# Patient Record
Sex: Male | Born: 1940
Health system: Southern US, Community
[De-identification: ages and names within clinical notes are randomized; demographics above are authoritative.]

## PROBLEM LIST (undated history)

## (undated) DIAGNOSIS — M71122 Other infective bursitis, left elbow: Secondary | ICD-10-CM

## (undated) HISTORY — PX: CATARACT EXTRACTION, BILATERAL: SHX1313

## (undated) HISTORY — PX: APPENDECTOMY: SHX54

---

## 1978-01-27 HISTORY — PX: ROTATOR CUFF REPAIR: SHX139

## 2004-11-19 ENCOUNTER — Ambulatory Visit (HOSPITAL_BASED_OUTPATIENT_CLINIC_OR_DEPARTMENT_OTHER): Admission: RE | Admit: 2004-11-19 | Discharge: 2004-11-19 | Payer: Self-pay | Admitting: Orthopedic Surgery

## 2013-06-24 ENCOUNTER — Ambulatory Visit (HOSPITAL_BASED_OUTPATIENT_CLINIC_OR_DEPARTMENT_OTHER): Payer: Medicare HMO | Admitting: Certified Registered"

## 2013-06-24 ENCOUNTER — Ambulatory Visit (HOSPITAL_BASED_OUTPATIENT_CLINIC_OR_DEPARTMENT_OTHER)
Admission: RE | Admit: 2013-06-24 | Discharge: 2013-06-24 | Disposition: A | Discharge status: Home / Self Care | Payer: Medicare HMO | Source: Ambulatory Visit | Attending: Orthopedic Surgery | Admitting: Orthopedic Surgery

## 2013-06-24 ENCOUNTER — Encounter (HOSPITAL_BASED_OUTPATIENT_CLINIC_OR_DEPARTMENT_OTHER): Payer: Self-pay

## 2013-06-24 ENCOUNTER — Encounter (HOSPITAL_BASED_OUTPATIENT_CLINIC_OR_DEPARTMENT_OTHER)
Admission: RE | Disposition: A | Discharge status: Home / Self Care | Payer: Self-pay | Source: Ambulatory Visit | Attending: Orthopedic Surgery

## 2013-06-24 ENCOUNTER — Encounter (HOSPITAL_BASED_OUTPATIENT_CLINIC_OR_DEPARTMENT_OTHER): Payer: Medicare HMO | Admitting: Certified Registered"

## 2013-06-24 DIAGNOSIS — M7020 Olecranon bursitis, unspecified elbow: Secondary | ICD-10-CM | POA: Insufficient documentation

## 2013-06-24 DIAGNOSIS — Z87891 Personal history of nicotine dependence: Secondary | ICD-10-CM | POA: Insufficient documentation

## 2013-06-24 DIAGNOSIS — M71122 Other infective bursitis, left elbow: Secondary | ICD-10-CM | POA: Diagnosis present

## 2013-06-24 DIAGNOSIS — A4902 Methicillin resistant Staphylococcus aureus infection, unspecified site: Secondary | ICD-10-CM | POA: Insufficient documentation

## 2013-06-24 HISTORY — DX: Other infective bursitis, left elbow: M71.122

## 2013-06-24 HISTORY — PX: I&D EXTREMITY: SHX5045

## 2013-06-24 LAB — POCT HEMOGLOBIN-HEMACUE: Hemoglobin: 15 g/dL (ref 13.0–17.0)

## 2013-06-24 SURGERY — IRRIGATION AND DEBRIDEMENT EXTREMITY
Anesthesia: General | Site: Elbow | Laterality: Left

## 2013-06-24 MED ORDER — CEFAZOLIN SODIUM-DEXTROSE 2-3 GM-% IV SOLR
INTRAVENOUS | Status: AC
  Filled 2013-06-24: qty 50

## 2013-06-24 MED ORDER — FENTANYL CITRATE 0.05 MG/ML IJ SOLN
INTRAMUSCULAR | Status: DC | PRN
  Administered 2013-06-24 (×2): 50 ug via INTRAVENOUS
  Administered 2013-06-24: 100 ug via INTRAVENOUS

## 2013-06-24 MED ORDER — SODIUM CHLORIDE 0.9 % IN NEBU
INHALATION_SOLUTION | RESPIRATORY_TRACT | Status: AC
  Filled 2013-06-24: qty 3

## 2013-06-24 MED ORDER — HYDROMORPHONE HCL PF 1 MG/ML IJ SOLN
INTRAMUSCULAR | Status: AC
  Filled 2013-06-24: qty 1

## 2013-06-24 MED ORDER — DEXAMETHASONE SODIUM PHOSPHATE 10 MG/ML IJ SOLN
INTRAMUSCULAR | Status: DC | PRN
  Administered 2013-06-24: 5 mg via INTRAVENOUS

## 2013-06-24 MED ORDER — MIDAZOLAM HCL 2 MG/2ML IJ SOLN
INTRAMUSCULAR | Status: AC
  Filled 2013-06-24: qty 2

## 2013-06-24 MED ORDER — CEPHALEXIN 500 MG PO CAPS: 500.0000 mg | ORAL_CAPSULE | Freq: Four times a day (QID) | ORAL | Status: DC

## 2013-06-24 MED ORDER — OXYCODONE HCL 5 MG/5ML PO SOLN: 5.0000 mg | Freq: Once | ORAL | Status: DC | PRN

## 2013-06-24 MED ORDER — PROPOFOL 10 MG/ML IV BOLUS
INTRAVENOUS | Status: DC | PRN
  Administered 2013-06-24: 150 mg via INTRAVENOUS

## 2013-06-24 MED ORDER — HYDROMORPHONE HCL PF 1 MG/ML IJ SOLN
0.2500 mg | INTRAMUSCULAR | Status: DC | PRN
  Administered 2013-06-24 (×2): 0.5 mg via INTRAVENOUS

## 2013-06-24 MED ORDER — FENTANYL CITRATE 0.05 MG/ML IJ SOLN
INTRAMUSCULAR | Status: AC
  Filled 2013-06-24: qty 4

## 2013-06-24 MED ORDER — MIDAZOLAM HCL 5 MG/5ML IJ SOLN
INTRAMUSCULAR | Status: DC | PRN
  Administered 2013-06-24: 2 mg via INTRAVENOUS

## 2013-06-24 MED ORDER — LIDOCAINE HCL (CARDIAC) 20 MG/ML IV SOLN
INTRAVENOUS | Status: DC | PRN
  Administered 2013-06-24: 40 mg via INTRAVENOUS

## 2013-06-24 MED ORDER — OXYCODONE HCL 5 MG PO TABS: 5.0000 mg | ORAL_TABLET | Freq: Once | ORAL | Status: DC | PRN

## 2013-06-24 MED ORDER — ONDANSETRON HCL 4 MG/2ML IJ SOLN
INTRAMUSCULAR | Status: DC | PRN
  Administered 2013-06-24: 4 mg via INTRAVENOUS

## 2013-06-24 MED ORDER — OXYCODONE-ACETAMINOPHEN 5-325 MG PO TABS: 1.0000 | ORAL_TABLET | Freq: Four times a day (QID) | ORAL | Status: DC | PRN

## 2013-06-24 MED ORDER — LACTATED RINGERS IV SOLN
INTRAVENOUS | Status: DC
  Administered 2013-06-24: 14:00:00 via INTRAVENOUS
  Administered 2013-06-24: 20 mL/h via INTRAVENOUS
  Administered 2013-06-24: 12:00:00 via INTRAVENOUS

## 2013-06-24 SURGICAL SUPPLY — 69 items
BANDAGE ELASTIC 4 VELCRO ST LF (GAUZE/BANDAGES/DRESSINGS) ×3 IMPLANT
BANDAGE ELASTIC 6 VELCRO ST LF (GAUZE/BANDAGES/DRESSINGS) IMPLANT
BANDAGE ESMARK 6X9 LF (GAUZE/BANDAGES/DRESSINGS) IMPLANT
BENZOIN TINCTURE PRP APPL 2/3 (GAUZE/BANDAGES/DRESSINGS) IMPLANT
BLADE 15 SAFETY STRL DISP (BLADE) ×3 IMPLANT
BNDG COHESIVE 4X5 TAN STRL (GAUZE/BANDAGES/DRESSINGS) ×3 IMPLANT
BNDG ESMARK 4X9 LF (GAUZE/BANDAGES/DRESSINGS) ×3 IMPLANT
BNDG ESMARK 6X9 LF (GAUZE/BANDAGES/DRESSINGS)
BNDG GAUZE ELAST 4 BULKY (GAUZE/BANDAGES/DRESSINGS) ×6 IMPLANT
BRUSH SCRUB EZ PLAIN DRY (MISCELLANEOUS) IMPLANT
CANISTER SUCT 1200ML W/VALVE (MISCELLANEOUS) ×3 IMPLANT
CLOSURE WOUND 1/2 X4 (GAUZE/BANDAGES/DRESSINGS)
COVER TABLE BACK 60X90 (DRAPES) ×3 IMPLANT
CUFF TOURNIQUET SINGLE 18IN (TOURNIQUET CUFF) ×3 IMPLANT
CUFF TOURNIQUET SINGLE 24IN (TOURNIQUET CUFF) IMPLANT
DECANTER SPIKE VIAL GLASS SM (MISCELLANEOUS) IMPLANT
DRAIN PENROSE 1/4X12 LTX STRL (WOUND CARE) ×3 IMPLANT
DRAPE EXTREMITY T 121X128X90 (DRAPE) ×3 IMPLANT
DRAPE INCISE IOBAN 66X45 STRL (DRAPES) IMPLANT
DRAPE OEC MINIVIEW 54X84 (DRAPES) IMPLANT
DRAPE U 20/CS (DRAPES) ×3 IMPLANT
DRAPE U-SHAPE 47X51 STRL (DRAPES) ×3 IMPLANT
DRSG PAD ABDOMINAL 8X10 ST (GAUZE/BANDAGES/DRESSINGS) IMPLANT
DURAPREP 26ML APPLICATOR (WOUND CARE) IMPLANT
ELECT REM PT RETURN 9FT ADLT (ELECTROSURGICAL) ×3
ELECTRODE REM PT RTRN 9FT ADLT (ELECTROSURGICAL) ×1 IMPLANT
GAUZE SPONGE 4X4 12PLY STRL (GAUZE/BANDAGES/DRESSINGS) ×3 IMPLANT
GAUZE XEROFORM 1X8 LF (GAUZE/BANDAGES/DRESSINGS) ×3 IMPLANT
GLOVE BIO SURGEON STRL SZ7 (GLOVE) ×3 IMPLANT
GLOVE BIO SURGEON STRL SZ8 (GLOVE) ×3 IMPLANT
GLOVE BIO SURGEON STRL SZ8.5 (GLOVE) ×3 IMPLANT
GLOVE BIOGEL PI IND STRL 8 (GLOVE) ×2 IMPLANT
GLOVE BIOGEL PI INDICATOR 8 (GLOVE) ×4
GLOVE EXAM NITRILE MD LF STRL (GLOVE) ×3 IMPLANT
GLOVE ORTHO TXT STRL SZ7.5 (GLOVE) ×3 IMPLANT
GOWN STRL REUS W/ TWL LRG LVL3 (GOWN DISPOSABLE) ×1 IMPLANT
GOWN STRL REUS W/ TWL XL LVL3 (GOWN DISPOSABLE) ×2 IMPLANT
GOWN STRL REUS W/TWL LRG LVL3 (GOWN DISPOSABLE) ×2
GOWN STRL REUS W/TWL XL LVL3 (GOWN DISPOSABLE) ×4
HANDPIECE INTERPULSE COAX TIP (DISPOSABLE)
NDL SAFETY ECLIPSE 18X1.5 (NEEDLE) ×1 IMPLANT
NEEDLE HYPO 18GX1.5 SHARP (NEEDLE) ×2
NEEDLE HYPO 25X1 1.5 SAFETY (NEEDLE) IMPLANT
NS IRRIG 1000ML POUR BTL (IV SOLUTION) ×3 IMPLANT
PACK BASIN DAY SURGERY FS (CUSTOM PROCEDURE TRAY) ×3 IMPLANT
PAD CAST 4YDX4 CTTN HI CHSV (CAST SUPPLIES) ×1 IMPLANT
PADDING CAST COTTON 4X4 STRL (CAST SUPPLIES) ×2
PENCIL BUTTON HOLSTER BLD 10FT (ELECTRODE) ×3 IMPLANT
SET HNDPC FAN SPRY TIP SCT (DISPOSABLE) IMPLANT
SLEEVE SCD COMPRESS KNEE MED (MISCELLANEOUS) ×3 IMPLANT
SPLINT FAST PLASTER 5X30 (CAST SUPPLIES) ×20
SPLINT PLASTER CAST FAST 5X30 (CAST SUPPLIES) ×10 IMPLANT
SPONGE LAP 18X18 X RAY DECT (DISPOSABLE) IMPLANT
SPONGE LAP 4X18 X RAY DECT (DISPOSABLE) ×3 IMPLANT
STAPLER VISISTAT 35W (STAPLE) IMPLANT
STRIP CLOSURE SKIN 1/2X4 (GAUZE/BANDAGES/DRESSINGS) IMPLANT
SUT ETHILON 3 0 PS 1 (SUTURE) ×3 IMPLANT
SUT ETHILON 4 0 PS 2 18 (SUTURE) IMPLANT
SUT MNCRL AB 4-0 PS2 18 (SUTURE) IMPLANT
SUT VIC AB 0 CT1 27 (SUTURE)
SUT VIC AB 0 CT1 27XBRD ANBCTR (SUTURE) IMPLANT
SUT VIC AB 2-0 SH 18 (SUTURE) IMPLANT
SUT VIC AB 3-0 SH 27 (SUTURE)
SUT VIC AB 3-0 SH 27X BRD (SUTURE) IMPLANT
SUT VICRYL 3-0 CR8 SH (SUTURE) IMPLANT
SUT VICRYL 4-0 PS2 18IN ABS (SUTURE) IMPLANT
SWAB COLLECTION DEVICE MRSA (MISCELLANEOUS) ×3 IMPLANT
SYR BULB 3OZ (MISCELLANEOUS) ×3 IMPLANT
TUBE ANAEROBIC SPECIMEN COL (MISCELLANEOUS) ×3 IMPLANT

## 2013-06-24 NOTE — Discharge Instructions (Signed)
Diet: As you were doing prior to hospitalization   Shower:  May shower but keep the wounds dry, use an occlusive plastic wrap, NO SOAKING IN TUB.  If the bandage gets wet, change with a clean dry gauze.  Dressing:  Change the dressing daily beginning Saturday, May 30, okay to remove the posterior splint to change the dressing, but reapply the splint after each dressing change to protect the elbow.     Activity:  Increase activity slowly as tolerated, but follow the weight bearing instructions below.  No lifting or driving for 6 weeks.   To prevent constipation: you may use a stool softener such as -  Colace (over the counter) 100 mg by mouth twice a day  Drink plenty of fluids (prune juice may be helpful) and high fiber foods Miralax (over the counter) for constipation as needed.    Itching:  If you experience itching with your medications, try taking only a single pain pill, or even half a pain pill at a time.  You may take up to 10 pain pills per day, and you can also use benadryl over the counter for itching or also to help with sleep.   Precautions:  If you experience chest pain or shortness of breath - call 911 immediately for transfer to the hospital emergency department!!  If you develop a fever greater that 101 F, purulent drainage from wound, increased redness or drainage from wound, or calf pain -- Call the office at [phone]                                                Follow- Up Appointment:  Please call for an appointment to be seen on the early part of next week, Monday, Tuesday, or Wednesday. - [phone]      Post Anesthesia Home Care Instructions  Activity: Get plenty of rest for the remainder of the day. A responsible adult should stay with you for 24 hours following the procedure.  For the next 24 hours, DO NOT: -Drive a car -Paediatric nurse -Drink alcoholic beverages -Take any medication unless instructed by your physician -Make any legal decisions  or sign important papers.  Meals: Start with liquid foods such as gelatin or soup. Progress to regular foods as tolerated. Avoid greasy, spicy, heavy foods. If nausea and/or vomiting occur, drink only clear liquids until the nausea and/or vomiting subsides. Call your physician if vomiting continues.  Special Instructions/Symptoms: Your throat may feel dry or sore from the anesthesia or the breathing tube placed in your throat during surgery. If this causes discomfort, gargle with warm salt water. The discomfort should disappear within 24 hours.

## 2013-06-24 NOTE — Op Note (Signed)
06/24/2013  2:49 PM  PATIENT:  Christian Perkins    PRE-OPERATIVE DIAGNOSIS:  left elbow septic olecranon  POST-OPERATIVE DIAGNOSIS:  Same  PROCEDURE:  IRRIGATION AND DEBRIDEMENT LEFT ELBOW with excision of septic olecranon bursa  SURGEON:  Johnny Bridge, MD  PHYSICIAN ASSISTANT: Joya Gaskins, OPA-C, present and scrubbed throughout the case, critical for completion in a timely fashion, and for retraction, instrumentation, and closure.  ANESTHESIA:   General  PREOPERATIVE INDICATIONS:  Christian Perkins is a  73 y.o. male with a diagnosis of septic left elbow bursitis who failed conservative measures and elected for surgical management.    The risks benefits and alternatives were discussed with the patient preoperatively including but not limited to the risks of infection, bleeding, nerve injury, cardiopulmonary complications, the need for revision surgery, among others, and the patient was willing to proceed.  OPERATIVE IMPLANTS: Penrose drain  OPERATIVE FINDINGS: Septic olecranon bursa did not appear to track into the joint or into the olecranon itself.  OPERATIVE PROCEDURE: The patient is brought to the operating room and placed in the supine position. Antibiotics were held until after operative cultures were given, and then 2 g of intravenous Ancef were given. The left upper extremity was prepped and draped in usual sterile fashion. Time out was performed. The arm was elevated, and the tourniquet was inflated.  An 18-gauge needle was used to aspirate about 3 cc of purulent fluid from the olecranon bursa.  I then made an incision directly over the olecranon bursa, over the sinus tract that was draining pus, and excised a sinus tract, and found a layer between the skin and the bursa itself. The necrotic bursal tissue was excised sharply, and then I used a rongeur, as well as a curette, to debride the deep layer. This was effectively overlying the periosteum and muscle fascia. The  incision I made was approximately 4 cm long.  After complete excision was performed, I irrigated the wounds copiously, and placed 2 sutures, leaving a fair amount of the incision wide open, and also placing a Penrose drain, and then applied sterile gauze, followed by a removable posterior splint.  The patient was awakened, the tourniquet was released, and he returned to the PACU in stable and satisfactory condition.  He tolerated the procedure well, and there were no complications. We will have him continue with his doxycycline, and plan for a wound check early next week.

## 2013-06-24 NOTE — Anesthesia Postprocedure Evaluation (Addendum)
  Anesthesia Post-op Note  Patient: Christian Perkins  Procedure(s) Performed: Procedure(s): IRRIGATION AND DEBRIDEMENT LEFT ELBOW (Left)  Patient Location: PACU  Anesthesia Type:General   Level of Consciousness: awake and alert   Airway and Oxygen Therapy: Patient Spontanous Breathing  Post-op Pain: mild  Post-op Assessment: Post-op Vital signs reviewed, Patient's Cardiovascular Status Stable and Respiratory Function Stable  Post-op Vital Signs: Reviewed  Filed Vitals:   06/24/13 1545  BP: 126/64  Pulse: 62  Temp:   Resp: 14    Complications: No apparent anesthesia complications

## 2013-06-24 NOTE — H&P (Signed)
PREOPERATIVE H&P  Chief Complaint: left elbow bursitis  HPI: Christian Perkins is a 73 y.o. male who presents for preoperative history and physical with a diagnosis of left elbow bursitis. Symptoms are rated as moderate to severe, and have been worsening.  This is significantly impairing activities of daily living.  He has elected for surgical management. He had a scrape that was about a month ago over the olecranon, and then had persistent swelling, ultimately it broke open and had drainage, did have an aspiration done by an outside physician, and has had purulent drainage coming from his olecranon bursa upper since. He has also had redness and erythema diffusely around the arm and forearm.  History reviewed. No pertinent past medical history., he does report hyperglycemia, but says that his blood sugars are reasonably well controlled and he is not truly diabetic. Past Surgical History  Procedure Laterality Date  . Rotator cuff repair Left 1980  . Appendectomy      age 8   History   Social History  . Marital Status: Married    Spouse Name: N/A    Number of Children: N/A  . Years of Education: N/A   Social History Main Topics  . Smoking status: Former Smoker -- 0.50 packs/day for 20 years    Types: Cigarettes    Quit date: 05/11/2013  . Smokeless tobacco: None  . Alcohol Use: Yes  . Drug Use: No  . Sexual Activity: None   Other Topics Concern  . None   Social History Narrative  . None   History reviewed. No pertinent family history. Not on File Prior to Admission medications   Not on File     Positive ROS: All other systems have been reviewed and were otherwise negative with the exception of those mentioned in the HPI and as above.  Physical Exam: General: Alert, no acute distress Cardiovascular: No pedal edema Respiratory: No cyanosis, no use of accessory musculature GI: No organomegaly, abdomen is soft and non-tender Skin: He has a pinhole opening over the  olecranon bursa with draining purulence Neurologic: Sensation intact distally Psychiatric: Patient is competent for consent with normal mood and affect Lymphatic: No axillary or cervical lymphadenopathy  MUSCULOSKELETAL: Left elbow has a boggy purulent draining olecranon bursa, with erythema diffusely around the forearm, but he still has essentially full range of motion of the elbow.  Assessment: left elbow septic olecranon bursitis with coexisting hyperglycemia, probable mild diabetes.  Plan: Plan for Procedure(s): IRRIGATION AND DEBRIDEMENT LEFT SEPTIC OLECRANON BURSITIS, ELBOW  The risks benefits and alternatives were discussed with the patient including but not limited to the risks of nonoperative treatment, versus surgical intervention including infection, bleeding, nerve injury,  blood clots, cardiopulmonary complications, morbidity, mortality, among others, and they were willing to proceed. We've also discussed the risk for the development of osteomyelitis, recurrent infection, the potential need for IV antibiotics, the potential for further surgical intervention, among others.  Johnny Bridge, MD Cell [phone]   06/24/2013 2:15 PM

## 2013-06-24 NOTE — Anesthesia Procedure Notes (Signed)
Procedure Name: LMA Insertion Date/Time: 06/24/2013 2:21 PM Performed by: Maryella Shivers Pre-anesthesia Checklist: Patient identified, Emergency Drugs available, Suction available and Patient being monitored Patient Re-evaluated:Patient Re-evaluated prior to inductionOxygen Delivery Method: Circle System Utilized Preoxygenation: Pre-oxygenation with 100% oxygen Intubation Type: IV induction Ventilation: Mask ventilation without difficulty LMA: LMA inserted LMA Size: 4.0 Number of attempts: 1 Airway Equipment and Method: bite block Placement Confirmation: positive ETCO2 Tube secured with: Tape Dental Injury: Teeth and Oropharynx as per pre-operative assessment

## 2013-06-24 NOTE — Anesthesia Preprocedure Evaluation (Signed)
Anesthesia Evaluation  Patient identified by MRN, date of birth, ID band Patient awake    Reviewed: Allergy & Precautions, H&P , NPO status , Patient's Chart, lab work & pertinent test results  Airway Mallampati: II  TM Distance: >3 FB Neck ROM: Full    Dental no notable dental hx. (+) Teeth Intact, Dental Advisory Given   Pulmonary neg pulmonary ROS,  breath sounds clear to auscultation  Pulmonary exam normal       Cardiovascular negative cardio ROS  Rhythm:Regular Rate:Normal     Neuro/Psych negative neurological ROS  negative psych ROS   GI/Hepatic negative GI ROS, Neg liver ROS,   Endo/Other  negative endocrine ROS  Renal/GU negative Renal ROS  negative genitourinary   Musculoskeletal   Abdominal   Peds  Hematology negative hematology ROS (+)   Anesthesia Other Findings   Reproductive/Obstetrics negative OB ROS                            Anesthesia Physical Anesthesia Plan  ASA: I  Anesthesia Plan: General   Post-op Pain Management:    Induction: Intravenous  Airway Management Planned: LMA  Additional Equipment:   Intra-op Plan:   Post-operative Plan: Extubation in OR  Informed Consent: I have reviewed the patients History and Physical, chart, labs and discussed the procedure including the risks, benefits and alternatives for the proposed anesthesia with the patient or authorized representative who has indicated his/her understanding and acceptance.   Dental advisory given  Plan Discussed with: CRNA  Anesthesia Plan Comments:         Anesthesia Quick Evaluation  

## 2013-06-24 NOTE — Transfer of Care (Signed)
Immediate Anesthesia Transfer of Care Note  Patient: Christian Perkins  Procedure(s) Performed: Procedure(s): IRRIGATION AND DEBRIDEMENT LEFT ELBOW (Left)  Patient Location: PACU  Anesthesia Type:General  Level of Consciousness: sedated  Airway & Oxygen Therapy: Patient Spontanous Breathing and Patient connected to face mask oxygen  Post-op Assessment: Report given to PACU RN and Post -op Vital signs reviewed and stable  Post vital signs: Reviewed and stable  Complications: No apparent anesthesia complications

## 2013-06-27 ENCOUNTER — Encounter (HOSPITAL_BASED_OUTPATIENT_CLINIC_OR_DEPARTMENT_OTHER): Payer: Self-pay | Admitting: Orthopedic Surgery

## 2013-06-27 ENCOUNTER — Other Ambulatory Visit: Payer: Self-pay | Admitting: Orthopedic Surgery

## 2013-06-27 ENCOUNTER — Ambulatory Visit (HOSPITAL_COMMUNITY)
Admission: RE | Admit: 2013-06-27 | Discharge: 2013-06-27 | Disposition: A | Discharge status: Home / Self Care | Payer: Medicare HMO | Source: Ambulatory Visit | Attending: Orthopedic Surgery | Admitting: Orthopedic Surgery

## 2013-06-27 DIAGNOSIS — M71122 Other infective bursitis, left elbow: Secondary | ICD-10-CM

## 2013-06-27 DIAGNOSIS — M009 Pyogenic arthritis, unspecified: Secondary | ICD-10-CM | POA: Insufficient documentation

## 2013-06-27 LAB — BODY FLUID CULTURE

## 2013-06-27 NOTE — Procedures (Signed)
R arm PowerPICC placed under US and fluoroscopy No ptx on spot chest radiograph. No complication No blood loss. See complete dictation in [facility] PACS.  

## 2013-06-28 ENCOUNTER — Ambulatory Visit (INDEPENDENT_AMBULATORY_CARE_PROVIDER_SITE_OTHER): Payer: Medicare HMO | Admitting: Internal Medicine

## 2013-06-28 ENCOUNTER — Encounter: Payer: Self-pay | Admitting: Internal Medicine

## 2013-06-28 VITALS — Temp 98.2°F | Ht 69.0 in | Wt 158.8 lb

## 2013-06-28 DIAGNOSIS — A499 Bacterial infection, unspecified: Secondary | ICD-10-CM

## 2013-06-28 DIAGNOSIS — E785 Hyperlipidemia, unspecified: Secondary | ICD-10-CM

## 2013-06-28 DIAGNOSIS — B9689 Other specified bacterial agents as the cause of diseases classified elsewhere: Secondary | ICD-10-CM

## 2013-06-28 DIAGNOSIS — M71122 Other infective bursitis, left elbow: Secondary | ICD-10-CM

## 2013-06-28 DIAGNOSIS — M7020 Olecranon bursitis, unspecified elbow: Secondary | ICD-10-CM

## 2013-06-28 MED ORDER — SODIUM CHLORIDE 0.9 % IV SOLN: 750.0000 mg | Freq: Two times a day (BID) | INTRAVENOUS | Status: DC

## 2013-06-28 NOTE — Progress Notes (Signed)
Patient ID: Christian Perkins, male   DOB: 03/07/1940, 73 y.o.   MRN: [id number]         [facility] for Infectious Disease  Reason for Consult: Left arm MRSA olecranon bursitis Referring Physician: Dr. Marchia Bond  Patient Active Problem List   Diagnosis Date Noted  . Dyslipidemia 06/28/2013  . Septic olecranon bursitis of left elbow 06/24/2013    Patient's Medications  New Prescriptions   VANCOMYCIN 750 MG IN SODIUM CHLORIDE 0.9 % 150 ML    Inject 750 mg into the vein every 12 (twelve) hours.  Previous Medications   CALCIUM CARBONATE 1250 MG CAPSULE    Take 1,250 mg by mouth daily.   OXYCODONE-ACETAMINOPHEN (ROXICET) 5-325 MG PER TABLET    Take 1-2 tablets by mouth every 6 (six) hours as needed for severe pain.   SIMVASTATIN (ZOCOR) 20 MG TABLET    Take 20 mg by mouth daily.  Modified Medications   No medications on file  Discontinued Medications   CEPHALEXIN (KEFLEX) 500 MG CAPSULE    Take 1 capsule (500 mg total) by mouth 4 (four) times daily.   DOXYCYCLINE (VIBRAMYCIN) 100 MG CAPSULE    Take 100 mg by mouth 2 (two) times daily.    Recommendations: 1. Check basic metabolic panel, sedimentation rate and C-reactive protein 2. Start IV vancomycin 3. Discontinue oral doxycycline 4. Followup in 10 days   Assessment: He has MRSA olecranon bursitis. I would suggest IV vancomycin as initial therapy, especially since the operative note indicates that the infection was very close to bone. He is in agreement with that plan.   HPI: Christian Perkins is a 73 y.o. male who recalls having a scrape or bump on his left elbow about 2-1/2 months ago. He states that he is bothered by very thin, sensitive skin and small bumps or scrapes off and heals slowly. About 6 weeks ago he noticed sudden swelling over the tip of his elbow associated with a small amount of spontaneous drainage of Hilton fluid. He was seen in the office of his primary care physician, Dr. Trilby Drummer, on May 19. The  area was aspirated and I am told that grew MRSA. The area of swelling to diminish significantly after aspiration but recurred around May 25 and was associated with spreading redness and swelling of his entire arm. He was referred to Dr. Mardelle Matte and started on oral doxycycline on May 26. He was admitted to the hospital and underwent incision and debridement with bursectomy on May 29. Operative cultures have grown MRSA again. Dr. Luanna Cole operative note indicated that the infection went down right over the periosteum. He is feeling better. Yesterday he had a PICC placed and is here today to consider IV antibiotic therapy.  Review of Systems: Constitutional: negative for chills, fevers and sweats Eyes: negative Ears, nose, mouth, throat, and face: negative Respiratory: negative Cardiovascular: negative Gastrointestinal: negative Genitourinary:negative    Past Medical History  Diagnosis Date  . Septic olecranon bursitis of left elbow 06/24/2013    History  Substance Use Topics  . Smoking status: Former Smoker -- 0.50 packs/day for 20 years    Types: Cigarettes    Quit date: 05/11/2013  . Smokeless tobacco: Not on file  . Alcohol Use: Yes    No family history on file. Not on File  OBJECTIVE: Temperature 98.2 F (36.8 C), temperature source Oral, height 5\' 9"  (1.753 m), weight 158 lb 12 oz (72.009 kg). General: He is in good spirits and in  no distress Skin: He has thin sun damaged skin on his arms and hands with scattered ecchymoses Lungs: Clear Cor: Regular S1 and S2 with no murmurs Joints and extremities: He has some mild nonpitting edema on the dorsum of his left hand and wrist. He indicates he has diminished swelling and redness over his forearm and upper arm. He has an incision over the tip of the left elbow with scant bloody drainage.  Microbiology: Recent Results (from the past 240 hour(s))  BODY FLUID CULTURE     Status: None   Collection Time    06/24/13  2:40 PM       Result Value Ref Range Status   Specimen Description SYNOVIAL FLUID   Final   Special Requests     Final   Value: PATIENT ON FOLLOWING DOXYCYCLINE LEFT ELBOW SENT SWAB   Gram Stain     Final   Value: MODERATE WBC PRESENT, PREDOMINANTLY PMN     NO ORGANISMS SEEN     Performed at [facility]   Culture     Final   Value: RARE METHICILLIN RESISTANT STAPHYLOCOCCUS AUREUS     Note: RIFAMPIN AND GENTAMICIN SHOULD NOT BE USED AS SINGLE DRUGS FOR TREATMENT OF STAPH INFECTIONS. This organism DOES NOT demonstrate inducible Clindamycin resistance in vitro.     Note: CRITICAL RESULT CALLED TO, READ BACK BY AND VERIFIED WITH: MAURICE JONES 06/26/13 2 1:26PM BY RUSCOE A.     Performed at [facility]   Report Status 06/27/2013 FINAL   Final   Organism ID, Bacteria METHICILLIN RESISTANT STAPHYLOCOCCUS AUREUS   Final  ANAEROBIC CULTURE     Status: None   Collection Time    06/24/13  2:40 PM      Result Value Ref Range Status   Specimen Description SYNOVIAL FLUID   Final   Special Requests     Final   Value: PATIENT ON FOLLOWING DOXYCYCLINE LEFT ELBOW SENT ON SWAB   Gram Stain     Final   Value: MODERATE WBC PRESENT, PREDOMINANTLY PMN     NO ORGANISMS SEEN     Performed at [facility]   Culture     Final   Value: NO ANAEROBES ISOLATED; CULTURE IN PROGRESS FOR 5 DAYS     Performed at [facility]   Report Status PENDING   Incomplete  TISSUE CULTURE     Status: None   Collection Time    06/24/13  2:47 PM      Result Value Ref Range Status   Specimen Description TISSUE   Final   Special Requests PATIENT ON FOLLOWING DOXYCYCLINE LEFT ELBOW BURSA   Final   Gram Stain     Final   Value: RARE WBC PRESENT,BOTH PMN AND MONONUCLEAR     NO ORGANISMS SEEN     Performed at [facility]     Final   Value: Culture reincubated for better growth     Performed at [facility]   Report Status PENDING   Incomplete  ANAEROBIC CULTURE     Status: None    Collection Time    06/24/13  2:47 PM      Result Value Ref Range Status   Specimen Description TISSUE   Final   Special Requests PATIENT ON FOLLOWING DOXYCYCLINE LEFT ELBOW BURSA   Final   Gram Stain     Final   Value: NO WBC SEEN     NO ORGANISMS SEEN  Performed at [facility]     Final   Value: NO ANAEROBES ISOLATED; CULTURE IN PROGRESS FOR 5 DAYS     Performed at [facility]   Report Status PENDING   Incomplete    Michel Bickers, MD [facility] for [facility] [phone] pager   [phone] cell 06/28/2013, 4:21 PM

## 2013-06-29 LAB — SEDIMENTATION RATE: Sed Rate: 18 mm/hr — ABNORMAL HIGH (ref 0–16)

## 2013-06-29 LAB — ANAEROBIC CULTURE: GRAM STAIN: NONE SEEN

## 2013-06-29 LAB — BASIC METABOLIC PANEL
BUN: 16 mg/dL (ref 6–23)
CO2: 24 meq/L (ref 19–32)
CREATININE: 0.79 mg/dL (ref 0.50–1.35)
Calcium: 9.6 mg/dL (ref 8.4–10.5)
Chloride: 101 mEq/L (ref 96–112)
Glucose, Bld: 104 mg/dL — ABNORMAL HIGH (ref 70–99)
Potassium: 4 mEq/L (ref 3.5–5.3)
Sodium: 138 mEq/L (ref 135–145)

## 2013-06-29 LAB — C-REACTIVE PROTEIN: CRP: 1.3 mg/dL — ABNORMAL HIGH (ref <0.60)

## 2013-06-30 LAB — TISSUE CULTURE

## 2013-07-10 ENCOUNTER — Telehealth: Payer: Self-pay | Admitting: Infectious Diseases

## 2013-07-10 NOTE — Telephone Encounter (Signed)
73 yo M with recent aspirate (06-24-13) for MRSA bursitis. He has been on vancomycin. This AM, he has developed a pruritic rash.He has also noted increased swelling at his surgical site. No drainage.  I advised him: - Take benadryl as needed  - Stop vanco - Call ID clinic in AM for new anbx - If worsening swelling at surgical site or new drainage, call his surgeon and go to ED.

## 2013-07-11 NOTE — Telephone Encounter (Signed)
Manuela Schwartz at [facility] called in reference to this patient and what to do with his Vanc as the dose was held pending a call today. Advised her not sure but will ask his doctor and give her a call back. Reminded her the patient has an appt tomorrow, 07/12/13, with Dr Megan Salon.

## 2013-07-12 ENCOUNTER — Ambulatory Visit (INDEPENDENT_AMBULATORY_CARE_PROVIDER_SITE_OTHER): Payer: Medicare HMO | Admitting: Internal Medicine

## 2013-07-12 ENCOUNTER — Encounter: Payer: Self-pay | Admitting: Internal Medicine

## 2013-07-12 VITALS — BP 146/67 | HR 70 | Temp 98.4°F | Wt 161.2 lb

## 2013-07-12 DIAGNOSIS — B9689 Other specified bacterial agents as the cause of diseases classified elsewhere: Secondary | ICD-10-CM

## 2013-07-12 DIAGNOSIS — A499 Bacterial infection, unspecified: Secondary | ICD-10-CM

## 2013-07-12 DIAGNOSIS — M7020 Olecranon bursitis, unspecified elbow: Secondary | ICD-10-CM

## 2013-07-12 DIAGNOSIS — M71122 Other infective bursitis, left elbow: Secondary | ICD-10-CM

## 2013-07-12 MED ORDER — DOXYCYCLINE HYCLATE 100 MG PO TABS: 100.0000 mg | ORAL_TABLET | Freq: Two times a day (BID) | ORAL | Status: DC

## 2013-07-12 NOTE — Progress Notes (Signed)
Patient ID: Christian Perkins, male   DOB: December 19, 1940, 73 y.o.   MRN: [id number]         [facility] for Infectious Disease  Patient Active Problem List   Diagnosis Date Noted  . Dyslipidemia 06/28/2013  . Septic olecranon bursitis of left elbow 06/24/2013    Patient's Medications  New Prescriptions   DOXYCYCLINE (VIBRA-TABS) 100 MG TABLET    Take 1 tablet (100 mg total) by mouth 2 (two) times daily.  Previous Medications   CALCIUM CARBONATE 1250 MG CAPSULE    Take 1,250 mg by mouth daily.   SIMVASTATIN (ZOCOR) 20 MG TABLET    Take 20 mg by mouth daily.  Modified Medications   No medications on file  Discontinued Medications   OXYCODONE-ACETAMINOPHEN (ROXICET) 5-325 MG PER TABLET    Take 1-2 tablets by mouth every 6 (six) hours as needed for severe pain.    Subjective: Christian Perkins is in for his followup appointment. He has now completed 18 days of total antibiotic therapy including about 11 days of IV vancomycin for his MRSA left olecranon bursitis. 4 days ago he developed a diffuse, pruritic red rash. He spoke with my partner, Dr. Johnnye Sima, who stopped the vancomycin. His rash has now resolved.  His left elbow is feeling much better. He is not needing any pain medication. He has not had any drainage from his incision. The sutures have been removed.  Review of Systems: Pertinent items are noted in HPI.  Past Medical History  Diagnosis Date  . Septic olecranon bursitis of left elbow 06/24/2013    History  Substance Use Topics  . Smoking status: Former Smoker -- 0.50 packs/day for 20 years    Types: Cigarettes    Quit date: 05/11/2013  . Smokeless tobacco: Not on file  . Alcohol Use: Yes    No family history on file.  Allergies  Allergen Reactions  . 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 His rash has resolved. He has scattered ecchymoses on both arms Left elbow: He has 3 Steri-Strips over his incision which is healing nicely. There is no drainage, erythema, swelling or tenderness with palpation    Assessment: His MRSA infection that has responded well to incision and drainage, olecranon bursectomy and antibiotic therapy. He appears to have developed an allergic reaction to vancomycin which is now resolved off of vancomycin. I gave him 2 options, the first being continued observation off antibiotics and the second being restarting oral doxycycline. He prefers the latter.  Plan: 1. Repeat doxycycline 100 mg twice daily 2. Followup in 2 weeks 3. Have PICC removed   Michel Bickers, MD [facility] for [place] [phone] pager   [phone] cell 07/12/2013, 3:52 PM

## 2013-07-26 ENCOUNTER — Ambulatory Visit (INDEPENDENT_AMBULATORY_CARE_PROVIDER_SITE_OTHER): Payer: Commercial Managed Care - HMO | Admitting: Internal Medicine

## 2013-07-26 ENCOUNTER — Encounter: Payer: Self-pay | Admitting: Internal Medicine

## 2013-07-26 VITALS — BP 144/65 | HR 80 | Temp 98.4°F | Wt 160.5 lb

## 2013-07-26 DIAGNOSIS — M7020 Olecranon bursitis, unspecified elbow: Secondary | ICD-10-CM

## 2013-07-26 DIAGNOSIS — B9689 Other specified bacterial agents as the cause of diseases classified elsewhere: Secondary | ICD-10-CM | POA: Diagnosis not present

## 2013-07-26 DIAGNOSIS — M71122 Other infective bursitis, left elbow: Secondary | ICD-10-CM

## 2013-07-26 DIAGNOSIS — A499 Bacterial infection, unspecified: Secondary | ICD-10-CM

## 2013-07-26 NOTE — Progress Notes (Signed)
Patient ID: Christian Perkins, male   DOB: [birth year], 73 y.o.   MRN: [id number]         [facility] for Infectious Disease  Patient Active Problem List   Diagnosis Date Noted  . Dyslipidemia 06/28/2013  . Septic olecranon bursitis of left elbow 06/24/2013    Patient's Medications  New Prescriptions   No medications on file  Previous Medications   CALCIUM CARBONATE 1250 MG CAPSULE    Take 1,250 mg by mouth daily.   SIMVASTATIN (ZOCOR) 20 MG TABLET    Take 20 mg by mouth daily.  Modified Medications   No medications on file  Discontinued Medications   DOXYCYCLINE (VIBRA-TABS) 100 MG TABLET    Take 1 tablet (100 mg total) by mouth 2 (two) times daily.    Subjective: Christian Perkins is in for his routine followup visit. He is feeling better. He is not having any pain in his left elbow. He completed antibiotics several days ago. He had a total of 28 days of therapy including 11 days of IV vancomycin followed recently by 10 days of oral doxycycline. Review of Systems: Pertinent items are noted in HPI.  Past Medical History  Diagnosis Date  . Septic olecranon bursitis of left elbow 06/24/2013    History  Substance Use Topics  . Smoking status: Former Smoker -- 0.50 packs/day for 20 years    Types: Cigarettes    Quit date: 05/11/2013  . Smokeless tobacco: Not on file  . Alcohol Use: Yes    No family history on file.  Allergies  Allergen Reactions  . Vancomycin Itching and Rash    Objective: Temp: 98.4 F (36.9 C) (06/30 1509) Temp src: Oral (06/30 1509) BP: 144/65 mmHg (06/30 1509) Pulse Rate: 80 (06/30 1509)  General: he is in good spirits and in no distress Left elbow: His incision is fully healed without evidence of active infection      Assessment: I suspect that his MRSA olecranon bursitis has been cured.  Plan: 1. Continue observation off of antibiotics 2. Followup here as needed   Michel Bickers, MD [facility] for [place] [phone] pager   [phone] cell 07/26/2013, 3:27 PM

## 2013-08-02 ENCOUNTER — Encounter: Payer: Self-pay | Admitting: Internal Medicine

## 2014-03-03 DIAGNOSIS — E785 Hyperlipidemia, unspecified: Secondary | ICD-10-CM | POA: Diagnosis not present

## 2014-03-03 DIAGNOSIS — E1121 Type 2 diabetes mellitus with diabetic nephropathy: Secondary | ICD-10-CM | POA: Diagnosis not present

## 2014-03-03 DIAGNOSIS — M859 Disorder of bone density and structure, unspecified: Secondary | ICD-10-CM | POA: Diagnosis not present

## 2014-03-15 DIAGNOSIS — E785 Hyperlipidemia, unspecified: Secondary | ICD-10-CM | POA: Diagnosis not present

## 2014-03-15 DIAGNOSIS — M859 Disorder of bone density and structure, unspecified: Secondary | ICD-10-CM | POA: Diagnosis not present

## 2014-03-15 DIAGNOSIS — N183 Chronic kidney disease, stage 3 (moderate): Secondary | ICD-10-CM | POA: Diagnosis not present

## 2014-03-15 DIAGNOSIS — E1165 Type 2 diabetes mellitus with hyperglycemia: Secondary | ICD-10-CM | POA: Diagnosis not present

## 2014-03-15 DIAGNOSIS — E1121 Type 2 diabetes mellitus with diabetic nephropathy: Secondary | ICD-10-CM | POA: Diagnosis not present

## 2014-03-15 DIAGNOSIS — F1010 Alcohol abuse, uncomplicated: Secondary | ICD-10-CM | POA: Diagnosis not present

## 2014-06-13 DIAGNOSIS — M859 Disorder of bone density and structure, unspecified: Secondary | ICD-10-CM | POA: Diagnosis not present

## 2014-06-13 DIAGNOSIS — E785 Hyperlipidemia, unspecified: Secondary | ICD-10-CM | POA: Diagnosis not present

## 2014-06-13 DIAGNOSIS — E1121 Type 2 diabetes mellitus with diabetic nephropathy: Secondary | ICD-10-CM | POA: Diagnosis not present

## 2014-06-20 DIAGNOSIS — N183 Chronic kidney disease, stage 3 (moderate): Secondary | ICD-10-CM | POA: Diagnosis not present

## 2014-06-20 DIAGNOSIS — E785 Hyperlipidemia, unspecified: Secondary | ICD-10-CM | POA: Diagnosis not present

## 2014-06-20 DIAGNOSIS — E1165 Type 2 diabetes mellitus with hyperglycemia: Secondary | ICD-10-CM | POA: Diagnosis not present

## 2014-06-20 DIAGNOSIS — E1121 Type 2 diabetes mellitus with diabetic nephropathy: Secondary | ICD-10-CM | POA: Diagnosis not present

## 2014-06-20 DIAGNOSIS — M859 Disorder of bone density and structure, unspecified: Secondary | ICD-10-CM | POA: Diagnosis not present

## 2014-06-20 DIAGNOSIS — Z23 Encounter for immunization: Secondary | ICD-10-CM | POA: Diagnosis not present

## 2014-09-13 DIAGNOSIS — E1121 Type 2 diabetes mellitus with diabetic nephropathy: Secondary | ICD-10-CM | POA: Diagnosis not present

## 2014-09-13 DIAGNOSIS — M859 Disorder of bone density and structure, unspecified: Secondary | ICD-10-CM | POA: Diagnosis not present

## 2014-09-13 DIAGNOSIS — E785 Hyperlipidemia, unspecified: Secondary | ICD-10-CM | POA: Diagnosis not present

## 2015-01-26 DIAGNOSIS — E119 Type 2 diabetes mellitus without complications: Secondary | ICD-10-CM | POA: Diagnosis not present

## 2015-01-26 DIAGNOSIS — H353131 Nonexudative age-related macular degeneration, bilateral, early dry stage: Secondary | ICD-10-CM | POA: Diagnosis not present

## 2015-01-26 DIAGNOSIS — H5210 Myopia, unspecified eye: Secondary | ICD-10-CM | POA: Diagnosis not present

## 2015-01-26 DIAGNOSIS — H52222 Regular astigmatism, left eye: Secondary | ICD-10-CM | POA: Diagnosis not present

## 2015-01-26 DIAGNOSIS — H5213 Myopia, bilateral: Secondary | ICD-10-CM | POA: Diagnosis not present

## 2015-01-26 DIAGNOSIS — H2513 Age-related nuclear cataract, bilateral: Secondary | ICD-10-CM | POA: Diagnosis not present

## 2015-02-07 DIAGNOSIS — Z1389 Encounter for screening for other disorder: Secondary | ICD-10-CM | POA: Diagnosis not present

## 2015-02-07 DIAGNOSIS — F17211 Nicotine dependence, cigarettes, in remission: Secondary | ICD-10-CM | POA: Diagnosis not present

## 2015-02-07 DIAGNOSIS — Z0000 Encounter for general adult medical examination without abnormal findings: Secondary | ICD-10-CM | POA: Diagnosis not present

## 2015-02-07 DIAGNOSIS — M8580 Other specified disorders of bone density and structure, unspecified site: Secondary | ICD-10-CM | POA: Diagnosis not present

## 2015-02-07 DIAGNOSIS — E785 Hyperlipidemia, unspecified: Secondary | ICD-10-CM | POA: Diagnosis not present

## 2015-02-07 DIAGNOSIS — E1122 Type 2 diabetes mellitus with diabetic chronic kidney disease: Secondary | ICD-10-CM | POA: Diagnosis not present

## 2015-02-07 DIAGNOSIS — Z125 Encounter for screening for malignant neoplasm of prostate: Secondary | ICD-10-CM | POA: Diagnosis not present

## 2015-02-14 DIAGNOSIS — E785 Hyperlipidemia, unspecified: Secondary | ICD-10-CM | POA: Diagnosis not present

## 2015-02-14 DIAGNOSIS — E1122 Type 2 diabetes mellitus with diabetic chronic kidney disease: Secondary | ICD-10-CM | POA: Diagnosis not present

## 2015-02-14 DIAGNOSIS — F17211 Nicotine dependence, cigarettes, in remission: Secondary | ICD-10-CM | POA: Diagnosis not present

## 2015-02-14 DIAGNOSIS — M8580 Other specified disorders of bone density and structure, unspecified site: Secondary | ICD-10-CM | POA: Diagnosis not present

## 2015-02-14 DIAGNOSIS — N183 Chronic kidney disease, stage 3 (moderate): Secondary | ICD-10-CM | POA: Diagnosis not present

## 2015-08-07 DIAGNOSIS — E559 Vitamin D deficiency, unspecified: Secondary | ICD-10-CM | POA: Diagnosis not present

## 2015-08-07 DIAGNOSIS — M8580 Other specified disorders of bone density and structure, unspecified site: Secondary | ICD-10-CM | POA: Diagnosis not present

## 2015-08-07 DIAGNOSIS — E785 Hyperlipidemia, unspecified: Secondary | ICD-10-CM | POA: Diagnosis not present

## 2015-08-07 DIAGNOSIS — E1122 Type 2 diabetes mellitus with diabetic chronic kidney disease: Secondary | ICD-10-CM | POA: Diagnosis not present

## 2015-08-14 DIAGNOSIS — N183 Chronic kidney disease, stage 3 (moderate): Secondary | ICD-10-CM | POA: Diagnosis not present

## 2015-08-14 DIAGNOSIS — E1122 Type 2 diabetes mellitus with diabetic chronic kidney disease: Secondary | ICD-10-CM | POA: Diagnosis not present

## 2015-08-14 DIAGNOSIS — G4700 Insomnia, unspecified: Secondary | ICD-10-CM | POA: Diagnosis not present

## 2015-08-14 DIAGNOSIS — E785 Hyperlipidemia, unspecified: Secondary | ICD-10-CM | POA: Diagnosis not present

## 2015-08-14 DIAGNOSIS — Z23 Encounter for immunization: Secondary | ICD-10-CM | POA: Diagnosis not present

## 2015-10-09 DIAGNOSIS — R1013 Epigastric pain: Secondary | ICD-10-CM | POA: Diagnosis not present

## 2015-10-09 DIAGNOSIS — Z23 Encounter for immunization: Secondary | ICD-10-CM | POA: Diagnosis not present

## 2015-10-15 DIAGNOSIS — K5669 Other intestinal obstruction: Secondary | ICD-10-CM | POA: Diagnosis not present

## 2015-10-15 DIAGNOSIS — R935 Abnormal findings on diagnostic imaging of other abdominal regions, including retroperitoneum: Secondary | ICD-10-CM | POA: Diagnosis not present

## 2016-07-17 DIAGNOSIS — H5213 Myopia, bilateral: Secondary | ICD-10-CM | POA: Diagnosis not present

## 2016-07-17 DIAGNOSIS — H52223 Regular astigmatism, bilateral: Secondary | ICD-10-CM | POA: Diagnosis not present

## 2016-07-17 DIAGNOSIS — H35363 Drusen (degenerative) of macula, bilateral: Secondary | ICD-10-CM | POA: Diagnosis not present

## 2016-07-17 DIAGNOSIS — Z7984 Long term (current) use of oral hypoglycemic drugs: Secondary | ICD-10-CM | POA: Diagnosis not present

## 2016-07-17 DIAGNOSIS — H2513 Age-related nuclear cataract, bilateral: Secondary | ICD-10-CM | POA: Diagnosis not present

## 2016-07-17 DIAGNOSIS — E119 Type 2 diabetes mellitus without complications: Secondary | ICD-10-CM | POA: Diagnosis not present

## 2016-07-17 DIAGNOSIS — H11153 Pinguecula, bilateral: Secondary | ICD-10-CM | POA: Diagnosis not present

## 2016-08-06 DIAGNOSIS — Z125 Encounter for screening for malignant neoplasm of prostate: Secondary | ICD-10-CM | POA: Diagnosis not present

## 2016-08-06 DIAGNOSIS — M8580 Other specified disorders of bone density and structure, unspecified site: Secondary | ICD-10-CM | POA: Diagnosis not present

## 2016-08-06 DIAGNOSIS — E785 Hyperlipidemia, unspecified: Secondary | ICD-10-CM | POA: Diagnosis not present

## 2016-08-06 DIAGNOSIS — E1122 Type 2 diabetes mellitus with diabetic chronic kidney disease: Secondary | ICD-10-CM | POA: Diagnosis not present

## 2016-08-13 DIAGNOSIS — N183 Chronic kidney disease, stage 3 (moderate): Secondary | ICD-10-CM | POA: Diagnosis not present

## 2016-08-13 DIAGNOSIS — E785 Hyperlipidemia, unspecified: Secondary | ICD-10-CM | POA: Diagnosis not present

## 2016-08-13 DIAGNOSIS — E1122 Type 2 diabetes mellitus with diabetic chronic kidney disease: Secondary | ICD-10-CM | POA: Diagnosis not present

## 2016-08-13 DIAGNOSIS — M8580 Other specified disorders of bone density and structure, unspecified site: Secondary | ICD-10-CM | POA: Diagnosis not present

## 2016-08-13 DIAGNOSIS — Z0000 Encounter for general adult medical examination without abnormal findings: Secondary | ICD-10-CM | POA: Diagnosis not present

## 2016-09-17 DIAGNOSIS — L565 Disseminated superficial actinic porokeratosis (DSAP): Secondary | ICD-10-CM | POA: Diagnosis not present

## 2016-09-17 DIAGNOSIS — L570 Actinic keratosis: Secondary | ICD-10-CM | POA: Diagnosis not present

## 2016-09-17 DIAGNOSIS — D1801 Hemangioma of skin and subcutaneous tissue: Secondary | ICD-10-CM | POA: Diagnosis not present

## 2017-01-14 DIAGNOSIS — L918 Other hypertrophic disorders of the skin: Secondary | ICD-10-CM | POA: Diagnosis not present

## 2017-01-14 DIAGNOSIS — L570 Actinic keratosis: Secondary | ICD-10-CM | POA: Diagnosis not present

## 2017-01-14 DIAGNOSIS — L821 Other seborrheic keratosis: Secondary | ICD-10-CM | POA: Diagnosis not present

## 2017-02-27 DIAGNOSIS — H00025 Hordeolum internum left lower eyelid: Secondary | ICD-10-CM | POA: Diagnosis not present

## 2017-03-11 DIAGNOSIS — E1121 Type 2 diabetes mellitus with diabetic nephropathy: Secondary | ICD-10-CM | POA: Diagnosis not present

## 2017-03-11 DIAGNOSIS — L239 Allergic contact dermatitis, unspecified cause: Secondary | ICD-10-CM | POA: Diagnosis not present

## 2017-03-11 DIAGNOSIS — E785 Hyperlipidemia, unspecified: Secondary | ICD-10-CM | POA: Diagnosis not present

## 2017-03-16 DIAGNOSIS — E1122 Type 2 diabetes mellitus with diabetic chronic kidney disease: Secondary | ICD-10-CM | POA: Diagnosis not present

## 2017-03-16 DIAGNOSIS — E785 Hyperlipidemia, unspecified: Secondary | ICD-10-CM | POA: Diagnosis not present

## 2017-03-16 DIAGNOSIS — N183 Chronic kidney disease, stage 3 (moderate): Secondary | ICD-10-CM | POA: Diagnosis not present

## 2017-03-18 DIAGNOSIS — H0015 Chalazion left lower eyelid: Secondary | ICD-10-CM | POA: Diagnosis not present

## 2017-03-23 DIAGNOSIS — E1121 Type 2 diabetes mellitus with diabetic nephropathy: Secondary | ICD-10-CM | POA: Diagnosis not present

## 2017-04-15 DIAGNOSIS — L821 Other seborrheic keratosis: Secondary | ICD-10-CM | POA: Diagnosis not present

## 2017-04-15 DIAGNOSIS — L570 Actinic keratosis: Secondary | ICD-10-CM | POA: Diagnosis not present

## 2017-04-15 DIAGNOSIS — D1801 Hemangioma of skin and subcutaneous tissue: Secondary | ICD-10-CM | POA: Diagnosis not present

## 2017-06-30 DIAGNOSIS — E1122 Type 2 diabetes mellitus with diabetic chronic kidney disease: Secondary | ICD-10-CM | POA: Diagnosis not present

## 2017-06-30 DIAGNOSIS — B029 Zoster without complications: Secondary | ICD-10-CM | POA: Diagnosis not present

## 2017-06-30 DIAGNOSIS — J309 Allergic rhinitis, unspecified: Secondary | ICD-10-CM | POA: Diagnosis not present

## 2017-06-30 DIAGNOSIS — E785 Hyperlipidemia, unspecified: Secondary | ICD-10-CM | POA: Diagnosis not present

## 2017-08-26 DIAGNOSIS — E785 Hyperlipidemia, unspecified: Secondary | ICD-10-CM | POA: Diagnosis not present

## 2017-08-26 DIAGNOSIS — Z125 Encounter for screening for malignant neoplasm of prostate: Secondary | ICD-10-CM | POA: Diagnosis not present

## 2017-08-26 DIAGNOSIS — M8580 Other specified disorders of bone density and structure, unspecified site: Secondary | ICD-10-CM | POA: Diagnosis not present

## 2017-08-26 DIAGNOSIS — E1122 Type 2 diabetes mellitus with diabetic chronic kidney disease: Secondary | ICD-10-CM | POA: Diagnosis not present

## 2017-08-31 DIAGNOSIS — J309 Allergic rhinitis, unspecified: Secondary | ICD-10-CM | POA: Diagnosis not present

## 2017-08-31 DIAGNOSIS — E785 Hyperlipidemia, unspecified: Secondary | ICD-10-CM | POA: Diagnosis not present

## 2017-08-31 DIAGNOSIS — E1121 Type 2 diabetes mellitus with diabetic nephropathy: Secondary | ICD-10-CM | POA: Diagnosis not present

## 2017-08-31 DIAGNOSIS — Z0000 Encounter for general adult medical examination without abnormal findings: Secondary | ICD-10-CM | POA: Diagnosis not present

## 2017-08-31 DIAGNOSIS — K219 Gastro-esophageal reflux disease without esophagitis: Secondary | ICD-10-CM | POA: Diagnosis not present

## 2017-08-31 DIAGNOSIS — B0229 Other postherpetic nervous system involvement: Secondary | ICD-10-CM | POA: Diagnosis not present

## 2017-08-31 DIAGNOSIS — F17211 Nicotine dependence, cigarettes, in remission: Secondary | ICD-10-CM | POA: Diagnosis not present

## 2017-08-31 DIAGNOSIS — M8580 Other specified disorders of bone density and structure, unspecified site: Secondary | ICD-10-CM | POA: Diagnosis not present

## 2017-08-31 DIAGNOSIS — Z1212 Encounter for screening for malignant neoplasm of rectum: Secondary | ICD-10-CM | POA: Diagnosis not present

## 2017-08-31 DIAGNOSIS — H9319 Tinnitus, unspecified ear: Secondary | ICD-10-CM | POA: Diagnosis not present

## 2017-08-31 DIAGNOSIS — N183 Chronic kidney disease, stage 3 (moderate): Secondary | ICD-10-CM | POA: Diagnosis not present

## 2017-08-31 DIAGNOSIS — N529 Male erectile dysfunction, unspecified: Secondary | ICD-10-CM | POA: Diagnosis not present

## 2017-09-15 ENCOUNTER — Other Ambulatory Visit: Payer: Self-pay | Admitting: Acute Care

## 2017-09-15 DIAGNOSIS — Z87891 Personal history of nicotine dependence: Secondary | ICD-10-CM

## 2017-09-15 DIAGNOSIS — Z122 Encounter for screening for malignant neoplasm of respiratory organs: Secondary | ICD-10-CM

## 2017-09-30 ENCOUNTER — Telehealth: Payer: Self-pay | Admitting: Acute Care

## 2017-10-02 ENCOUNTER — Inpatient Hospital Stay: Admission: RE | Admit: 2017-10-02 | Payer: Self-pay | Source: Ambulatory Visit

## 2017-10-02 ENCOUNTER — Encounter: Payer: Commercial Managed Care - HMO | Admitting: Acute Care

## 2017-10-02 NOTE — Telephone Encounter (Signed)
Spoke with pt and rescheduled SDMV to 10/09/17 2:30 CT will be rescheduled Nothing further needed

## 2017-10-09 ENCOUNTER — Ambulatory Visit (INDEPENDENT_AMBULATORY_CARE_PROVIDER_SITE_OTHER)
Admission: RE | Admit: 2017-10-09 | Discharge: 2017-10-09 | Disposition: A | Discharge status: Home / Self Care | Payer: PPO | Source: Ambulatory Visit | Attending: Acute Care | Admitting: Acute Care

## 2017-10-09 ENCOUNTER — Ambulatory Visit (INDEPENDENT_AMBULATORY_CARE_PROVIDER_SITE_OTHER): Payer: PPO | Admitting: Acute Care

## 2017-10-09 ENCOUNTER — Encounter: Payer: Self-pay | Admitting: Acute Care

## 2017-10-09 DIAGNOSIS — Z87891 Personal history of nicotine dependence: Secondary | ICD-10-CM

## 2017-10-09 DIAGNOSIS — Z122 Encounter for screening for malignant neoplasm of respiratory organs: Secondary | ICD-10-CM

## 2017-10-09 NOTE — Progress Notes (Signed)
Shared Decision Making Visit Lung Cancer Screening Program [phone])   Eligibility:  Age 77 y.o.  Pack Years Smoking History Calculation 55 pack year smoking history (# packs/per year x # years smoked)  Recent History of coughing up blood  no  Unexplained weight loss? no ( >Than 15 pounds within the last 6 months )  Prior History Lung / other cancer no (Diagnosis within the last 5 years already requiring surveillance chest CT Scans).  Smoking Status Former Smoker  Former Smokers: Years since quit: 1 year  Quit Date: 2018  Visit Components:  Discussion included one or more decision making aids. yes  Discussion included risk/benefits of screening. yes  Discussion included potential follow up diagnostic testing for abnormal scans. yes  Discussion included meaning and risk of over diagnosis. yes  Discussion included meaning and risk of False Positives. yes  Discussion included meaning of total radiation exposure. yes  Counseling Included:  Importance of adherence to annual lung cancer LDCT screening. yes  Impact of comorbidities on ability to participate in the program. yes  Ability and willingness to under diagnostic treatment. yes  Smoking Cessation Counseling:  Current Smokers:   Discussed importance of smoking cessation. yes  Information about tobacco cessation classes and interventions provided to patient. yes  Patient provided with "ticket" for LDCT Scan. yes  Symptomatic Patient. no  Counseling  Diagnosis Code: Tobacco Use Z72.0  Asymptomatic Patient yes  Counseling (Intermediate counseling: > three minutes counseling) S5053  Former Smokers:   Discussed the importance of maintaining cigarette abstinence. yes  Diagnosis Code: Personal History of Nicotine Dependence. Z76.734  Information about tobacco cessation classes and interventions provided to patient. Yes  Patient provided with "ticket" for LDCT Scan. yes  Written Order for Lung Cancer  Screening with LDCT placed in Epic. Yes (CT Chest Lung Cancer Screening Low Dose W/O CM) [id number] Z12.2-Screening of respiratory organs Z87.891-Personal history of nicotine dependence  I spent 25 minutes of face to face time with Mr. Hyndman  discussing the risks and benefits of lung cancer screening. We viewed a power point together that explained in detail the above noted topics. We took the time to pause the power point at intervals to allow for questions to be asked and answered to ensure understanding. We discussed that he had taken the single most powerful action possible to decrease his risk of developing lung cancer when he quit smoking. I counseled him to remain smoke free, and to contact me if he ever had the desire to smoke again so that I can provide resources and tools to help support the effort to remain smoke free. We discussed the time and location of the scan, and that either  Doroteo Glassman RN or I will call with the results within  24-48 hours of receiving them. He has my card and contact information in the event he needs to speak with me, in addition to a copy of the power point we reviewed as a resource. He verbalized understanding of all of the above and had no further questions upon leaving the office.     I explained to the patient that there has been a high incidence of coronary artery disease noted on these exams. I explained that this is a non-gated exam therefore degree or severity cannot be determined. This patient is currently on statin therapy. I have asked the patient to follow-up with their PCP regarding any incidental finding of coronary artery disease and management with diet or medication as they feel is  clinically indicated. The patient verbalized understanding of the above and had no further questions.     Magdalen Spatz, NP 10/09/2017 2:30 PM

## 2017-10-12 ENCOUNTER — Other Ambulatory Visit: Payer: Self-pay | Admitting: Acute Care

## 2017-10-12 DIAGNOSIS — Z122 Encounter for screening for malignant neoplasm of respiratory organs: Secondary | ICD-10-CM

## 2017-10-12 DIAGNOSIS — Z87891 Personal history of nicotine dependence: Secondary | ICD-10-CM

## 2017-10-16 DIAGNOSIS — E119 Type 2 diabetes mellitus without complications: Secondary | ICD-10-CM | POA: Diagnosis not present

## 2017-10-16 DIAGNOSIS — H40013 Open angle with borderline findings, low risk, bilateral: Secondary | ICD-10-CM | POA: Diagnosis not present

## 2017-10-16 DIAGNOSIS — Z7984 Long term (current) use of oral hypoglycemic drugs: Secondary | ICD-10-CM | POA: Diagnosis not present

## 2017-10-16 DIAGNOSIS — H40053 Ocular hypertension, bilateral: Secondary | ICD-10-CM | POA: Diagnosis not present

## 2017-12-02 DIAGNOSIS — E1121 Type 2 diabetes mellitus with diabetic nephropathy: Secondary | ICD-10-CM | POA: Diagnosis not present

## 2017-12-02 DIAGNOSIS — I2510 Atherosclerotic heart disease of native coronary artery without angina pectoris: Secondary | ICD-10-CM | POA: Diagnosis not present

## 2017-12-02 DIAGNOSIS — Z23 Encounter for immunization: Secondary | ICD-10-CM | POA: Diagnosis not present

## 2017-12-02 DIAGNOSIS — L570 Actinic keratosis: Secondary | ICD-10-CM | POA: Diagnosis not present

## 2017-12-02 DIAGNOSIS — N183 Chronic kidney disease, stage 3 (moderate): Secondary | ICD-10-CM | POA: Diagnosis not present

## 2017-12-02 DIAGNOSIS — Z13820 Encounter for screening for osteoporosis: Secondary | ICD-10-CM | POA: Diagnosis not present

## 2017-12-02 DIAGNOSIS — K219 Gastro-esophageal reflux disease without esophagitis: Secondary | ICD-10-CM | POA: Diagnosis not present

## 2017-12-02 DIAGNOSIS — Z0000 Encounter for general adult medical examination without abnormal findings: Secondary | ICD-10-CM | POA: Diagnosis not present

## 2018-01-07 DIAGNOSIS — I2510 Atherosclerotic heart disease of native coronary artery without angina pectoris: Secondary | ICD-10-CM | POA: Diagnosis not present

## 2018-01-07 DIAGNOSIS — N183 Chronic kidney disease, stage 3 (moderate): Secondary | ICD-10-CM | POA: Diagnosis not present

## 2018-01-07 DIAGNOSIS — E785 Hyperlipidemia, unspecified: Secondary | ICD-10-CM | POA: Diagnosis not present

## 2018-01-07 DIAGNOSIS — K219 Gastro-esophageal reflux disease without esophagitis: Secondary | ICD-10-CM | POA: Diagnosis not present

## 2018-01-07 DIAGNOSIS — E1121 Type 2 diabetes mellitus with diabetic nephropathy: Secondary | ICD-10-CM | POA: Diagnosis not present

## 2018-02-17 DIAGNOSIS — H40013 Open angle with borderline findings, low risk, bilateral: Secondary | ICD-10-CM | POA: Diagnosis not present

## 2018-02-17 DIAGNOSIS — H40053 Ocular hypertension, bilateral: Secondary | ICD-10-CM | POA: Diagnosis not present

## 2018-04-06 DIAGNOSIS — J302 Other seasonal allergic rhinitis: Secondary | ICD-10-CM | POA: Diagnosis not present

## 2018-04-06 DIAGNOSIS — E1121 Type 2 diabetes mellitus with diabetic nephropathy: Secondary | ICD-10-CM | POA: Diagnosis not present

## 2018-04-06 DIAGNOSIS — K219 Gastro-esophageal reflux disease without esophagitis: Secondary | ICD-10-CM | POA: Diagnosis not present

## 2018-07-13 DIAGNOSIS — E1122 Type 2 diabetes mellitus with diabetic chronic kidney disease: Secondary | ICD-10-CM | POA: Diagnosis not present

## 2018-07-13 DIAGNOSIS — R030 Elevated blood-pressure reading, without diagnosis of hypertension: Secondary | ICD-10-CM | POA: Diagnosis not present

## 2018-07-13 DIAGNOSIS — E1121 Type 2 diabetes mellitus with diabetic nephropathy: Secondary | ICD-10-CM | POA: Diagnosis not present

## 2018-07-13 DIAGNOSIS — N183 Chronic kidney disease, stage 3 (moderate): Secondary | ICD-10-CM | POA: Diagnosis not present

## 2018-07-13 DIAGNOSIS — E785 Hyperlipidemia, unspecified: Secondary | ICD-10-CM | POA: Diagnosis not present

## 2018-09-01 DIAGNOSIS — Q828 Other specified congenital malformations of skin: Secondary | ICD-10-CM | POA: Diagnosis not present

## 2018-09-01 DIAGNOSIS — D692 Other nonthrombocytopenic purpura: Secondary | ICD-10-CM | POA: Diagnosis not present

## 2018-09-01 DIAGNOSIS — E785 Hyperlipidemia, unspecified: Secondary | ICD-10-CM | POA: Diagnosis not present

## 2018-09-01 DIAGNOSIS — L821 Other seborrheic keratosis: Secondary | ICD-10-CM | POA: Diagnosis not present

## 2018-09-01 DIAGNOSIS — C44729 Squamous cell carcinoma of skin of left lower limb, including hip: Secondary | ICD-10-CM | POA: Diagnosis not present

## 2018-09-01 DIAGNOSIS — L570 Actinic keratosis: Secondary | ICD-10-CM | POA: Diagnosis not present

## 2018-09-01 DIAGNOSIS — D485 Neoplasm of uncertain behavior of skin: Secondary | ICD-10-CM | POA: Diagnosis not present

## 2018-09-01 DIAGNOSIS — Z1159 Encounter for screening for other viral diseases: Secondary | ICD-10-CM | POA: Diagnosis not present

## 2018-09-01 DIAGNOSIS — E1122 Type 2 diabetes mellitus with diabetic chronic kidney disease: Secondary | ICD-10-CM | POA: Diagnosis not present

## 2018-09-01 DIAGNOSIS — Z125 Encounter for screening for malignant neoplasm of prostate: Secondary | ICD-10-CM | POA: Diagnosis not present

## 2018-09-07 DIAGNOSIS — Z86010 Personal history of colonic polyps: Secondary | ICD-10-CM | POA: Diagnosis not present

## 2018-09-07 DIAGNOSIS — J439 Emphysema, unspecified: Secondary | ICD-10-CM | POA: Diagnosis not present

## 2018-09-07 DIAGNOSIS — E785 Hyperlipidemia, unspecified: Secondary | ICD-10-CM | POA: Diagnosis not present

## 2018-09-07 DIAGNOSIS — H9319 Tinnitus, unspecified ear: Secondary | ICD-10-CM | POA: Diagnosis not present

## 2018-09-07 DIAGNOSIS — Z1212 Encounter for screening for malignant neoplasm of rectum: Secondary | ICD-10-CM | POA: Diagnosis not present

## 2018-09-07 DIAGNOSIS — E1121 Type 2 diabetes mellitus with diabetic nephropathy: Secondary | ICD-10-CM | POA: Diagnosis not present

## 2018-09-07 DIAGNOSIS — D692 Other nonthrombocytopenic purpura: Secondary | ICD-10-CM | POA: Diagnosis not present

## 2018-09-07 DIAGNOSIS — M8580 Other specified disorders of bone density and structure, unspecified site: Secondary | ICD-10-CM | POA: Diagnosis not present

## 2018-09-07 DIAGNOSIS — K219 Gastro-esophageal reflux disease without esophagitis: Secondary | ICD-10-CM | POA: Diagnosis not present

## 2018-09-07 DIAGNOSIS — Z0000 Encounter for general adult medical examination without abnormal findings: Secondary | ICD-10-CM | POA: Diagnosis not present

## 2018-09-07 DIAGNOSIS — I2510 Atherosclerotic heart disease of native coronary artery without angina pectoris: Secondary | ICD-10-CM | POA: Diagnosis not present

## 2018-09-07 DIAGNOSIS — Z87891 Personal history of nicotine dependence: Secondary | ICD-10-CM | POA: Diagnosis not present

## 2018-09-07 DIAGNOSIS — F17211 Nicotine dependence, cigarettes, in remission: Secondary | ICD-10-CM | POA: Diagnosis not present

## 2018-10-26 DIAGNOSIS — H5213 Myopia, bilateral: Secondary | ICD-10-CM | POA: Diagnosis not present

## 2018-10-26 DIAGNOSIS — H52222 Regular astigmatism, left eye: Secondary | ICD-10-CM | POA: Diagnosis not present

## 2018-10-26 DIAGNOSIS — H524 Presbyopia: Secondary | ICD-10-CM | POA: Diagnosis not present

## 2018-10-26 DIAGNOSIS — H3509 Other intraretinal microvascular abnormalities: Secondary | ICD-10-CM | POA: Diagnosis not present

## 2018-10-28 ENCOUNTER — Inpatient Hospital Stay: Admission: RE | Admit: 2018-10-28 | Payer: PPO | Source: Ambulatory Visit

## 2018-11-24 ENCOUNTER — Inpatient Hospital Stay: Admission: RE | Admit: 2018-11-24 | Payer: PPO | Source: Ambulatory Visit

## 2018-12-01 ENCOUNTER — Inpatient Hospital Stay: Admission: RE | Admit: 2018-12-01 | Payer: PPO | Source: Ambulatory Visit

## 2018-12-09 ENCOUNTER — Inpatient Hospital Stay: Admission: RE | Admit: 2018-12-09 | Payer: PPO | Source: Ambulatory Visit

## 2018-12-09 ENCOUNTER — Other Ambulatory Visit: Payer: Self-pay | Admitting: *Deleted

## 2018-12-09 DIAGNOSIS — Z87891 Personal history of nicotine dependence: Secondary | ICD-10-CM

## 2018-12-09 DIAGNOSIS — Z122 Encounter for screening for malignant neoplasm of respiratory organs: Secondary | ICD-10-CM

## 2018-12-14 ENCOUNTER — Inpatient Hospital Stay: Admission: RE | Admit: 2018-12-14 | Payer: PPO | Source: Ambulatory Visit

## 2018-12-17 ENCOUNTER — Other Ambulatory Visit: Payer: Self-pay

## 2018-12-28 ENCOUNTER — Ambulatory Visit (INDEPENDENT_AMBULATORY_CARE_PROVIDER_SITE_OTHER)
Admission: RE | Admit: 2018-12-28 | Discharge: 2018-12-28 | Disposition: A | Discharge status: Home / Self Care | Payer: PPO | Source: Ambulatory Visit | Attending: Acute Care | Admitting: Acute Care

## 2018-12-28 ENCOUNTER — Encounter (INDEPENDENT_AMBULATORY_CARE_PROVIDER_SITE_OTHER): Payer: Self-pay

## 2018-12-28 ENCOUNTER — Other Ambulatory Visit: Payer: Self-pay

## 2018-12-28 DIAGNOSIS — Z87891 Personal history of nicotine dependence: Secondary | ICD-10-CM

## 2018-12-28 DIAGNOSIS — E1121 Type 2 diabetes mellitus with diabetic nephropathy: Secondary | ICD-10-CM | POA: Diagnosis not present

## 2018-12-28 DIAGNOSIS — I2510 Atherosclerotic heart disease of native coronary artery without angina pectoris: Secondary | ICD-10-CM | POA: Diagnosis not present

## 2018-12-28 DIAGNOSIS — E785 Hyperlipidemia, unspecified: Secondary | ICD-10-CM | POA: Diagnosis not present

## 2018-12-28 DIAGNOSIS — Z122 Encounter for screening for malignant neoplasm of respiratory organs: Secondary | ICD-10-CM

## 2019-01-03 ENCOUNTER — Other Ambulatory Visit: Payer: Self-pay | Admitting: *Deleted

## 2019-01-03 DIAGNOSIS — Z87891 Personal history of nicotine dependence: Secondary | ICD-10-CM

## 2019-01-03 DIAGNOSIS — Z122 Encounter for screening for malignant neoplasm of respiratory organs: Secondary | ICD-10-CM

## 2019-01-18 DIAGNOSIS — D1801 Hemangioma of skin and subcutaneous tissue: Secondary | ICD-10-CM | POA: Diagnosis not present

## 2019-01-18 DIAGNOSIS — L570 Actinic keratosis: Secondary | ICD-10-CM | POA: Diagnosis not present

## 2019-02-24 ENCOUNTER — Ambulatory Visit: Payer: PPO

## 2019-03-05 ENCOUNTER — Ambulatory Visit: Payer: PPO | Attending: Internal Medicine

## 2019-03-05 DIAGNOSIS — Z23 Encounter for immunization: Secondary | ICD-10-CM | POA: Insufficient documentation

## 2019-03-05 NOTE — Progress Notes (Signed)
   Covid-19 Vaccination Clinic  Name:  Christian Perkins    MRN: [id number] DOB: 12/15/1940  03/05/2019  [name] was observed post Covid-19 immunization for 15 minutes without incidence. He was provided with Vaccine Information Sheet and instruction to access the V-Safe system.   [name] was instructed to call 911 with any severe reactions post vaccine: Marland Kitchen Difficulty breathing  . Swelling of your face and throat  . A fast heartbeat  . A bad rash all over your body  . Dizziness and weakness    Immunizations Administered    Name Date Dose VIS Date Route   Pfizer COVID-19 Vaccine 03/05/2019 11:00 AM 0.3 mL 01/07/2019 Intramuscular   Manufacturer: [place]   Lot: [id number]   [place]: [id number]

## 2019-03-09 DIAGNOSIS — R0981 Nasal congestion: Secondary | ICD-10-CM | POA: Diagnosis not present

## 2019-03-09 DIAGNOSIS — M19041 Primary osteoarthritis, right hand: Secondary | ICD-10-CM | POA: Diagnosis not present

## 2019-03-09 DIAGNOSIS — M19042 Primary osteoarthritis, left hand: Secondary | ICD-10-CM | POA: Diagnosis not present

## 2019-03-09 DIAGNOSIS — N401 Enlarged prostate with lower urinary tract symptoms: Secondary | ICD-10-CM | POA: Diagnosis not present

## 2019-03-09 DIAGNOSIS — N529 Male erectile dysfunction, unspecified: Secondary | ICD-10-CM | POA: Diagnosis not present

## 2019-03-09 DIAGNOSIS — E1121 Type 2 diabetes mellitus with diabetic nephropathy: Secondary | ICD-10-CM | POA: Diagnosis not present

## 2019-03-29 ENCOUNTER — Ambulatory Visit: Payer: PPO

## 2019-03-29 ENCOUNTER — Ambulatory Visit: Payer: PPO | Attending: Internal Medicine

## 2019-03-29 DIAGNOSIS — Z23 Encounter for immunization: Secondary | ICD-10-CM | POA: Insufficient documentation

## 2019-03-29 NOTE — Progress Notes (Signed)
   Covid-19 Vaccination Clinic  Name:  Christian Perkins    MRN: [id number] DOB: 1940-02-08  03/29/2019  Christian Perkins was observed post Covid-19 immunization for 15 minutes without incident. He was provided with Vaccine Information Sheet and instruction to access the V-Safe system.   Christian Perkins was instructed to call 911 with any severe reactions post vaccine: Marland Kitchen Difficulty breathing  . Swelling of face and throat  . A fast heartbeat  . A bad rash all over body  . Dizziness and weakness   Immunizations Administered    Name Date Dose VIS Date Route   Pfizer COVID-19 Vaccine 03/29/2019 12:58 PM 0.3 mL 01/07/2019 Intramuscular   Manufacturer: [place]   Lot: [id number]   [place]: [id number]

## 2019-06-16 DIAGNOSIS — E1121 Type 2 diabetes mellitus with diabetic nephropathy: Secondary | ICD-10-CM | POA: Diagnosis not present

## 2019-06-16 DIAGNOSIS — E785 Hyperlipidemia, unspecified: Secondary | ICD-10-CM | POA: Diagnosis not present

## 2019-06-21 DIAGNOSIS — K219 Gastro-esophageal reflux disease without esophagitis: Secondary | ICD-10-CM | POA: Diagnosis not present

## 2019-06-21 DIAGNOSIS — E785 Hyperlipidemia, unspecified: Secondary | ICD-10-CM | POA: Diagnosis not present

## 2019-06-21 DIAGNOSIS — E1121 Type 2 diabetes mellitus with diabetic nephropathy: Secondary | ICD-10-CM | POA: Diagnosis not present

## 2019-06-21 DIAGNOSIS — I2510 Atherosclerotic heart disease of native coronary artery without angina pectoris: Secondary | ICD-10-CM | POA: Diagnosis not present

## 2019-07-04 DIAGNOSIS — Z1159 Encounter for screening for other viral diseases: Secondary | ICD-10-CM | POA: Diagnosis not present

## 2019-07-07 DIAGNOSIS — Z86010 Personal history of colonic polyps: Secondary | ICD-10-CM | POA: Diagnosis not present

## 2019-07-07 DIAGNOSIS — Z800 Family history of malignant neoplasm of digestive organs: Secondary | ICD-10-CM | POA: Diagnosis not present

## 2019-07-07 DIAGNOSIS — K5730 Diverticulosis of large intestine without perforation or abscess without bleeding: Secondary | ICD-10-CM | POA: Diagnosis not present

## 2019-07-19 DIAGNOSIS — L814 Other melanin hyperpigmentation: Secondary | ICD-10-CM | POA: Diagnosis not present

## 2019-07-19 DIAGNOSIS — C44729 Squamous cell carcinoma of skin of left lower limb, including hip: Secondary | ICD-10-CM | POA: Diagnosis not present

## 2019-07-19 DIAGNOSIS — D485 Neoplasm of uncertain behavior of skin: Secondary | ICD-10-CM | POA: Diagnosis not present

## 2019-07-19 DIAGNOSIS — L920 Granuloma annulare: Secondary | ICD-10-CM | POA: Diagnosis not present

## 2019-07-19 DIAGNOSIS — Q828 Other specified congenital malformations of skin: Secondary | ICD-10-CM | POA: Diagnosis not present

## 2019-07-19 DIAGNOSIS — D1801 Hemangioma of skin and subcutaneous tissue: Secondary | ICD-10-CM | POA: Diagnosis not present

## 2019-07-19 DIAGNOSIS — D0472 Carcinoma in situ of skin of left lower limb, including hip: Secondary | ICD-10-CM | POA: Diagnosis not present

## 2019-07-19 DIAGNOSIS — L570 Actinic keratosis: Secondary | ICD-10-CM | POA: Diagnosis not present

## 2019-07-19 DIAGNOSIS — D692 Other nonthrombocytopenic purpura: Secondary | ICD-10-CM | POA: Diagnosis not present

## 2019-07-19 DIAGNOSIS — L565 Disseminated superficial actinic porokeratosis (DSAP): Secondary | ICD-10-CM | POA: Diagnosis not present

## 2019-10-12 DIAGNOSIS — N183 Chronic kidney disease, stage 3 unspecified: Secondary | ICD-10-CM | POA: Diagnosis not present

## 2019-10-12 DIAGNOSIS — Z125 Encounter for screening for malignant neoplasm of prostate: Secondary | ICD-10-CM | POA: Diagnosis not present

## 2019-10-12 DIAGNOSIS — M8580 Other specified disorders of bone density and structure, unspecified site: Secondary | ICD-10-CM | POA: Diagnosis not present

## 2019-10-12 DIAGNOSIS — E785 Hyperlipidemia, unspecified: Secondary | ICD-10-CM | POA: Diagnosis not present

## 2019-10-18 DIAGNOSIS — Z0001 Encounter for general adult medical examination with abnormal findings: Secondary | ICD-10-CM | POA: Diagnosis not present

## 2019-10-18 DIAGNOSIS — Z23 Encounter for immunization: Secondary | ICD-10-CM | POA: Diagnosis not present

## 2019-10-18 DIAGNOSIS — J439 Emphysema, unspecified: Secondary | ICD-10-CM | POA: Diagnosis not present

## 2019-10-18 DIAGNOSIS — F17211 Nicotine dependence, cigarettes, in remission: Secondary | ICD-10-CM | POA: Diagnosis not present

## 2019-10-18 DIAGNOSIS — E1121 Type 2 diabetes mellitus with diabetic nephropathy: Secondary | ICD-10-CM | POA: Diagnosis not present

## 2019-10-18 DIAGNOSIS — I2510 Atherosclerotic heart disease of native coronary artery without angina pectoris: Secondary | ICD-10-CM | POA: Diagnosis not present

## 2019-10-18 DIAGNOSIS — J302 Other seasonal allergic rhinitis: Secondary | ICD-10-CM | POA: Diagnosis not present

## 2019-10-18 DIAGNOSIS — E785 Hyperlipidemia, unspecified: Secondary | ICD-10-CM | POA: Diagnosis not present

## 2019-10-18 DIAGNOSIS — K219 Gastro-esophageal reflux disease without esophagitis: Secondary | ICD-10-CM | POA: Diagnosis not present

## 2019-10-18 DIAGNOSIS — I700 Atherosclerosis of aorta: Secondary | ICD-10-CM | POA: Diagnosis not present

## 2019-10-18 DIAGNOSIS — D692 Other nonthrombocytopenic purpura: Secondary | ICD-10-CM | POA: Diagnosis not present

## 2019-10-20 DIAGNOSIS — Z85828 Personal history of other malignant neoplasm of skin: Secondary | ICD-10-CM | POA: Diagnosis not present

## 2019-10-20 DIAGNOSIS — D692 Other nonthrombocytopenic purpura: Secondary | ICD-10-CM | POA: Diagnosis not present

## 2019-10-20 DIAGNOSIS — L565 Disseminated superficial actinic porokeratosis (DSAP): Secondary | ICD-10-CM | POA: Diagnosis not present

## 2019-10-27 DIAGNOSIS — H5213 Myopia, bilateral: Secondary | ICD-10-CM | POA: Diagnosis not present

## 2019-10-27 DIAGNOSIS — H52222 Regular astigmatism, left eye: Secondary | ICD-10-CM | POA: Diagnosis not present

## 2019-10-27 DIAGNOSIS — E119 Type 2 diabetes mellitus without complications: Secondary | ICD-10-CM | POA: Diagnosis not present

## 2019-10-27 DIAGNOSIS — H524 Presbyopia: Secondary | ICD-10-CM | POA: Diagnosis not present

## 2019-12-29 ENCOUNTER — Ambulatory Visit (INDEPENDENT_AMBULATORY_CARE_PROVIDER_SITE_OTHER)
Admission: RE | Admit: 2019-12-29 | Discharge: 2019-12-29 | Disposition: A | Discharge status: Home / Self Care | Payer: PPO | Source: Ambulatory Visit | Attending: Acute Care | Admitting: Acute Care

## 2019-12-29 ENCOUNTER — Other Ambulatory Visit: Payer: Self-pay

## 2019-12-29 DIAGNOSIS — Z87891 Personal history of nicotine dependence: Secondary | ICD-10-CM

## 2019-12-29 DIAGNOSIS — Z122 Encounter for screening for malignant neoplasm of respiratory organs: Secondary | ICD-10-CM

## 2019-12-30 ENCOUNTER — Inpatient Hospital Stay: Admission: RE | Admit: 2019-12-30 | Payer: PPO | Source: Ambulatory Visit

## 2020-01-03 NOTE — Progress Notes (Signed)
Please call patient and let them  know their  low dose Ct was read as a Lung RADS 2: nodules that are benign in appearance and behavior with a very low likelihood of becoming a clinically active cancer due to size or lack of growth. Recommendation per radiology is for a repeat LDCT in 12 months. .Please let them  know we will order and schedule their  annual screening scan for 12/2020. Please let them  know there was notation of CAD on their  scan.  Please remind the patient  that this is a non-gated exam therefore degree or severity of disease  cannot be determined. Please have them  follow up with their PCP regarding potential risk factor modification, dietary therapy or pharmacologic therapy if clinically indicated. Pt.  is  currently on statin therapy. Please place order for annual  screening scan for  12/2020 and fax results to PCP. Thanks so much. 

## 2020-01-05 ENCOUNTER — Other Ambulatory Visit: Payer: Self-pay | Admitting: *Deleted

## 2020-01-05 DIAGNOSIS — Z87891 Personal history of nicotine dependence: Secondary | ICD-10-CM

## 2020-02-09 DIAGNOSIS — K219 Gastro-esophageal reflux disease without esophagitis: Secondary | ICD-10-CM | POA: Diagnosis not present

## 2020-02-09 DIAGNOSIS — M8580 Other specified disorders of bone density and structure, unspecified site: Secondary | ICD-10-CM | POA: Diagnosis not present

## 2020-02-09 DIAGNOSIS — E1122 Type 2 diabetes mellitus with diabetic chronic kidney disease: Secondary | ICD-10-CM | POA: Diagnosis not present

## 2020-02-09 DIAGNOSIS — J302 Other seasonal allergic rhinitis: Secondary | ICD-10-CM | POA: Diagnosis not present

## 2020-02-09 DIAGNOSIS — M19042 Primary osteoarthritis, left hand: Secondary | ICD-10-CM | POA: Diagnosis not present

## 2020-02-09 DIAGNOSIS — E785 Hyperlipidemia, unspecified: Secondary | ICD-10-CM | POA: Diagnosis not present

## 2020-02-09 DIAGNOSIS — N183 Chronic kidney disease, stage 3 unspecified: Secondary | ICD-10-CM | POA: Diagnosis not present

## 2020-02-09 DIAGNOSIS — Z13820 Encounter for screening for osteoporosis: Secondary | ICD-10-CM | POA: Diagnosis not present

## 2020-02-09 DIAGNOSIS — E559 Vitamin D deficiency, unspecified: Secondary | ICD-10-CM | POA: Diagnosis not present

## 2020-02-23 DIAGNOSIS — L570 Actinic keratosis: Secondary | ICD-10-CM | POA: Diagnosis not present

## 2020-02-23 DIAGNOSIS — L565 Disseminated superficial actinic porokeratosis (DSAP): Secondary | ICD-10-CM | POA: Diagnosis not present

## 2020-04-26 DIAGNOSIS — N182 Chronic kidney disease, stage 2 (mild): Secondary | ICD-10-CM | POA: Diagnosis not present

## 2020-04-26 DIAGNOSIS — M8580 Other specified disorders of bone density and structure, unspecified site: Secondary | ICD-10-CM | POA: Diagnosis not present

## 2020-04-26 DIAGNOSIS — K219 Gastro-esophageal reflux disease without esophagitis: Secondary | ICD-10-CM | POA: Diagnosis not present

## 2020-04-26 DIAGNOSIS — E1122 Type 2 diabetes mellitus with diabetic chronic kidney disease: Secondary | ICD-10-CM | POA: Diagnosis not present

## 2020-05-15 DIAGNOSIS — E1122 Type 2 diabetes mellitus with diabetic chronic kidney disease: Secondary | ICD-10-CM | POA: Diagnosis not present

## 2020-05-15 DIAGNOSIS — E785 Hyperlipidemia, unspecified: Secondary | ICD-10-CM | POA: Diagnosis not present

## 2020-05-24 DIAGNOSIS — E1122 Type 2 diabetes mellitus with diabetic chronic kidney disease: Secondary | ICD-10-CM | POA: Diagnosis not present

## 2020-05-24 DIAGNOSIS — I2510 Atherosclerotic heart disease of native coronary artery without angina pectoris: Secondary | ICD-10-CM | POA: Diagnosis not present

## 2020-05-24 DIAGNOSIS — E785 Hyperlipidemia, unspecified: Secondary | ICD-10-CM | POA: Diagnosis not present

## 2020-05-24 DIAGNOSIS — F5101 Primary insomnia: Secondary | ICD-10-CM | POA: Diagnosis not present

## 2020-05-29 DIAGNOSIS — E1122 Type 2 diabetes mellitus with diabetic chronic kidney disease: Secondary | ICD-10-CM | POA: Diagnosis not present

## 2020-06-26 DIAGNOSIS — E1122 Type 2 diabetes mellitus with diabetic chronic kidney disease: Secondary | ICD-10-CM | POA: Diagnosis not present

## 2020-06-26 DIAGNOSIS — N182 Chronic kidney disease, stage 2 (mild): Secondary | ICD-10-CM | POA: Diagnosis not present

## 2020-06-26 DIAGNOSIS — K219 Gastro-esophageal reflux disease without esophagitis: Secondary | ICD-10-CM | POA: Diagnosis not present

## 2020-07-24 DIAGNOSIS — L570 Actinic keratosis: Secondary | ICD-10-CM | POA: Diagnosis not present

## 2020-07-24 DIAGNOSIS — L565 Disseminated superficial actinic porokeratosis (DSAP): Secondary | ICD-10-CM | POA: Diagnosis not present

## 2020-08-26 DIAGNOSIS — N182 Chronic kidney disease, stage 2 (mild): Secondary | ICD-10-CM | POA: Diagnosis not present

## 2020-08-26 DIAGNOSIS — E1122 Type 2 diabetes mellitus with diabetic chronic kidney disease: Secondary | ICD-10-CM | POA: Diagnosis not present

## 2020-08-26 DIAGNOSIS — K219 Gastro-esophageal reflux disease without esophagitis: Secondary | ICD-10-CM | POA: Diagnosis not present

## 2020-08-26 DIAGNOSIS — M8580 Other specified disorders of bone density and structure, unspecified site: Secondary | ICD-10-CM | POA: Diagnosis not present

## 2020-09-26 DIAGNOSIS — N182 Chronic kidney disease, stage 2 (mild): Secondary | ICD-10-CM | POA: Diagnosis not present

## 2020-09-26 DIAGNOSIS — M8580 Other specified disorders of bone density and structure, unspecified site: Secondary | ICD-10-CM | POA: Diagnosis not present

## 2020-09-26 DIAGNOSIS — K219 Gastro-esophageal reflux disease without esophagitis: Secondary | ICD-10-CM | POA: Diagnosis not present

## 2020-09-26 DIAGNOSIS — E1122 Type 2 diabetes mellitus with diabetic chronic kidney disease: Secondary | ICD-10-CM | POA: Diagnosis not present

## 2020-10-18 DIAGNOSIS — H2513 Age-related nuclear cataract, bilateral: Secondary | ICD-10-CM | POA: Diagnosis not present

## 2020-10-18 DIAGNOSIS — Z0000 Encounter for general adult medical examination without abnormal findings: Secondary | ICD-10-CM | POA: Diagnosis not present

## 2020-10-18 DIAGNOSIS — H5213 Myopia, bilateral: Secondary | ICD-10-CM | POA: Diagnosis not present

## 2020-10-18 DIAGNOSIS — E119 Type 2 diabetes mellitus without complications: Secondary | ICD-10-CM | POA: Diagnosis not present

## 2020-11-02 DIAGNOSIS — Z23 Encounter for immunization: Secondary | ICD-10-CM | POA: Diagnosis not present

## 2020-11-02 DIAGNOSIS — E1121 Type 2 diabetes mellitus with diabetic nephropathy: Secondary | ICD-10-CM | POA: Diagnosis not present

## 2020-11-02 DIAGNOSIS — M8580 Other specified disorders of bone density and structure, unspecified site: Secondary | ICD-10-CM | POA: Diagnosis not present

## 2020-11-02 DIAGNOSIS — Z0000 Encounter for general adult medical examination without abnormal findings: Secondary | ICD-10-CM | POA: Diagnosis not present

## 2020-11-02 DIAGNOSIS — I2510 Atherosclerotic heart disease of native coronary artery without angina pectoris: Secondary | ICD-10-CM | POA: Diagnosis not present

## 2020-11-02 DIAGNOSIS — D692 Other nonthrombocytopenic purpura: Secondary | ICD-10-CM | POA: Diagnosis not present

## 2020-11-02 DIAGNOSIS — Z125 Encounter for screening for malignant neoplasm of prostate: Secondary | ICD-10-CM | POA: Diagnosis not present

## 2020-11-02 DIAGNOSIS — I700 Atherosclerosis of aorta: Secondary | ICD-10-CM | POA: Diagnosis not present

## 2020-11-02 DIAGNOSIS — K219 Gastro-esophageal reflux disease without esophagitis: Secondary | ICD-10-CM | POA: Diagnosis not present

## 2020-11-05 ENCOUNTER — Other Ambulatory Visit: Payer: Self-pay | Admitting: Internal Medicine

## 2020-11-05 DIAGNOSIS — I2510 Atherosclerotic heart disease of native coronary artery without angina pectoris: Secondary | ICD-10-CM

## 2020-11-26 ENCOUNTER — Ambulatory Visit
Admission: RE | Admit: 2020-11-26 | Discharge: 2020-11-26 | Disposition: A | Discharge status: Home / Self Care | Payer: No Typology Code available for payment source | Source: Ambulatory Visit | Attending: Internal Medicine | Admitting: Internal Medicine

## 2020-11-26 DIAGNOSIS — E1122 Type 2 diabetes mellitus with diabetic chronic kidney disease: Secondary | ICD-10-CM | POA: Diagnosis not present

## 2020-11-26 DIAGNOSIS — M8580 Other specified disorders of bone density and structure, unspecified site: Secondary | ICD-10-CM | POA: Diagnosis not present

## 2020-11-26 DIAGNOSIS — K219 Gastro-esophageal reflux disease without esophagitis: Secondary | ICD-10-CM | POA: Diagnosis not present

## 2020-11-26 DIAGNOSIS — N182 Chronic kidney disease, stage 2 (mild): Secondary | ICD-10-CM | POA: Diagnosis not present

## 2020-11-26 DIAGNOSIS — I2510 Atherosclerotic heart disease of native coronary artery without angina pectoris: Secondary | ICD-10-CM

## 2020-12-18 DIAGNOSIS — I2510 Atherosclerotic heart disease of native coronary artery without angina pectoris: Secondary | ICD-10-CM | POA: Diagnosis not present

## 2020-12-18 DIAGNOSIS — I2584 Coronary atherosclerosis due to calcified coronary lesion: Secondary | ICD-10-CM | POA: Diagnosis not present

## 2021-01-14 ENCOUNTER — Other Ambulatory Visit: Payer: Self-pay | Admitting: *Deleted

## 2021-01-14 DIAGNOSIS — Z87891 Personal history of nicotine dependence: Secondary | ICD-10-CM

## 2021-01-16 DIAGNOSIS — L570 Actinic keratosis: Secondary | ICD-10-CM | POA: Diagnosis not present

## 2021-01-16 DIAGNOSIS — L821 Other seborrheic keratosis: Secondary | ICD-10-CM | POA: Diagnosis not present

## 2021-01-16 DIAGNOSIS — L565 Disseminated superficial actinic porokeratosis (DSAP): Secondary | ICD-10-CM | POA: Diagnosis not present

## 2021-01-31 ENCOUNTER — Other Ambulatory Visit: Payer: Self-pay

## 2021-01-31 ENCOUNTER — Ambulatory Visit (INDEPENDENT_AMBULATORY_CARE_PROVIDER_SITE_OTHER)
Admission: RE | Admit: 2021-01-31 | Discharge: 2021-01-31 | Disposition: A | Discharge status: Home / Self Care | Payer: Medicare HMO | Source: Ambulatory Visit | Attending: Acute Care | Admitting: Acute Care

## 2021-01-31 DIAGNOSIS — Z87891 Personal history of nicotine dependence: Secondary | ICD-10-CM

## 2021-02-04 ENCOUNTER — Other Ambulatory Visit: Payer: Self-pay | Admitting: Acute Care

## 2021-02-04 DIAGNOSIS — Z87891 Personal history of nicotine dependence: Secondary | ICD-10-CM

## 2021-02-11 DIAGNOSIS — E785 Hyperlipidemia, unspecified: Secondary | ICD-10-CM | POA: Diagnosis not present

## 2021-02-11 DIAGNOSIS — Z125 Encounter for screening for malignant neoplasm of prostate: Secondary | ICD-10-CM | POA: Diagnosis not present

## 2021-02-11 DIAGNOSIS — E1122 Type 2 diabetes mellitus with diabetic chronic kidney disease: Secondary | ICD-10-CM | POA: Diagnosis not present

## 2021-02-13 DIAGNOSIS — E785 Hyperlipidemia, unspecified: Secondary | ICD-10-CM | POA: Diagnosis not present

## 2021-02-13 DIAGNOSIS — I2584 Coronary atherosclerosis due to calcified coronary lesion: Secondary | ICD-10-CM | POA: Diagnosis not present

## 2021-02-13 DIAGNOSIS — E1121 Type 2 diabetes mellitus with diabetic nephropathy: Secondary | ICD-10-CM | POA: Diagnosis not present

## 2021-03-04 ENCOUNTER — Other Ambulatory Visit: Payer: Self-pay

## 2021-03-04 ENCOUNTER — Ambulatory Visit: Payer: Medicare HMO | Admitting: Cardiovascular Disease

## 2021-03-04 ENCOUNTER — Encounter: Payer: Self-pay | Admitting: Cardiovascular Disease

## 2021-03-04 VITALS — BP 108/52 | HR 68 | Ht 68.0 in | Wt 142.1 lb

## 2021-03-04 DIAGNOSIS — F5101 Primary insomnia: Secondary | ICD-10-CM | POA: Insufficient documentation

## 2021-03-04 DIAGNOSIS — Z720 Tobacco use: Secondary | ICD-10-CM | POA: Insufficient documentation

## 2021-03-04 DIAGNOSIS — L570 Actinic keratosis: Secondary | ICD-10-CM | POA: Insufficient documentation

## 2021-03-04 DIAGNOSIS — Z87891 Personal history of nicotine dependence: Secondary | ICD-10-CM

## 2021-03-04 DIAGNOSIS — I700 Atherosclerosis of aorta: Secondary | ICD-10-CM | POA: Diagnosis not present

## 2021-03-04 DIAGNOSIS — H9319 Tinnitus, unspecified ear: Secondary | ICD-10-CM | POA: Insufficient documentation

## 2021-03-04 DIAGNOSIS — I2510 Atherosclerotic heart disease of native coronary artery without angina pectoris: Secondary | ICD-10-CM | POA: Diagnosis not present

## 2021-03-04 DIAGNOSIS — Z01810 Encounter for preprocedural cardiovascular examination: Secondary | ICD-10-CM | POA: Diagnosis not present

## 2021-03-04 DIAGNOSIS — J439 Emphysema, unspecified: Secondary | ICD-10-CM | POA: Insufficient documentation

## 2021-03-04 DIAGNOSIS — E782 Mixed hyperlipidemia: Secondary | ICD-10-CM | POA: Diagnosis not present

## 2021-03-04 DIAGNOSIS — M8580 Other specified disorders of bone density and structure, unspecified site: Secondary | ICD-10-CM | POA: Insufficient documentation

## 2021-03-04 DIAGNOSIS — Z9049 Acquired absence of other specified parts of digestive tract: Secondary | ICD-10-CM | POA: Insufficient documentation

## 2021-03-04 DIAGNOSIS — J432 Centrilobular emphysema: Secondary | ICD-10-CM | POA: Diagnosis not present

## 2021-03-04 DIAGNOSIS — K219 Gastro-esophageal reflux disease without esophagitis: Secondary | ICD-10-CM | POA: Insufficient documentation

## 2021-03-04 DIAGNOSIS — N529 Male erectile dysfunction, unspecified: Secondary | ICD-10-CM | POA: Insufficient documentation

## 2021-03-04 DIAGNOSIS — Z125 Encounter for screening for malignant neoplasm of prostate: Secondary | ICD-10-CM | POA: Insufficient documentation

## 2021-03-04 DIAGNOSIS — E1121 Type 2 diabetes mellitus with diabetic nephropathy: Secondary | ICD-10-CM | POA: Insufficient documentation

## 2021-03-04 DIAGNOSIS — B0229 Other postherpetic nervous system involvement: Secondary | ICD-10-CM | POA: Insufficient documentation

## 2021-03-04 DIAGNOSIS — Z8042 Family history of malignant neoplasm of prostate: Secondary | ICD-10-CM | POA: Insufficient documentation

## 2021-03-04 DIAGNOSIS — D692 Other nonthrombocytopenic purpura: Secondary | ICD-10-CM | POA: Insufficient documentation

## 2021-03-04 DIAGNOSIS — N401 Enlarged prostate with lower urinary tract symptoms: Secondary | ICD-10-CM | POA: Insufficient documentation

## 2021-03-04 DIAGNOSIS — M19049 Primary osteoarthritis, unspecified hand: Secondary | ICD-10-CM | POA: Insufficient documentation

## 2021-03-04 DIAGNOSIS — N182 Chronic kidney disease, stage 2 (mild): Secondary | ICD-10-CM | POA: Insufficient documentation

## 2021-03-04 DIAGNOSIS — E785 Hyperlipidemia, unspecified: Secondary | ICD-10-CM | POA: Insufficient documentation

## 2021-03-04 DIAGNOSIS — Z86010 Personal history of colonic polyps: Secondary | ICD-10-CM | POA: Insufficient documentation

## 2021-03-04 DIAGNOSIS — J302 Other seasonal allergic rhinitis: Secondary | ICD-10-CM | POA: Insufficient documentation

## 2021-03-04 NOTE — Progress Notes (Signed)
Cardiology Office Note  Date:  03/04/2021   ID:  Brasen, Bundren [birth year], MRN [id number]  PCP:  Deland Pretty, MD   Chief Complaint  Patient presents with   New Patient (Initial Visit)    Ref by Deland Pretty, M.D. for Aortic atherosclerosis and Coronary calcification. Medications reviewed by the patient verbally.     HPI:  Mr. Christian Perkins is a 81 year old gentleman with past medical history of Type 2 diabetes with chronic kidney disease Former smoker Aortic atherosclerosis Coronary calcification Hyperlipidemia Emphysema Who presents by referral from Dr. Shelia Media consultation of his coronary calcification, smoking, high cholesterol, diabetes  Follows up today to discuss recent screening study ordered by Dr. Shelia Media He does report long smoking history in the past, still smokes socially but not on a regular basis as he did 10 years ago  Active, no regular exercise program Denies any significant shortness of breath on exertion, no chest pain concerning for angina Overall reports that he feels well with no complaints Walks his dogs, plays golf with no symptoms  CT coronary calcium scoring November 26, 2020 Images pulled up and reviewed with him Coronary calcium score 931 is at the 74th percentile for the patient's age, sex and race.  Also reviewed CT lung screening study done last month Lipid panel requested from primary care and reviewed on today's visit Numbers at goal  A1c trending higher, was not taking his medications secondary to cost reports A1c typically runs low 6 range  A1C 7.0 Ran out meds Normally 6.2  EKG personally reviewed by myself on todays visit Normal sinus rhythm rate 68 bpm no significant ST-T wave changes   PMH:   has a past medical history of Septic olecranon bursitis of left elbow (06/24/2013).  PSH:    Past Surgical History:  Procedure Laterality Date   APPENDECTOMY     age 44   I & D EXTREMITY Left 06/24/2013   Procedure: IRRIGATION AND  DEBRIDEMENT LEFT ELBOW;  Surgeon: Johnny Bridge, MD;  Location: [place];  Service: Orthopedics;  Laterality: Left;   ROTATOR CUFF REPAIR Left 1980    Current Outpatient Medications  Medication Sig Dispense Refill   aspirin 81 MG chewable tablet      calcium carbonate 1250 MG capsule Take 1,250 mg by mouth daily.     cholecalciferol (VITAMIN D3) 25 MCG (1000 UNIT) tablet      ezetimibe (ZETIA) 10 MG tablet Take 10 mg by mouth daily.     ipratropium (ATROVENT) 0.06 % nasal spray      omeprazole (PRILOSEC) 20 MG capsule Take 20 mg by mouth daily.     Oyster Shell Calcium 500 MG TABS      rosuvastatin (CRESTOR) 20 MG tablet Take 20 mg by mouth at bedtime.     TRIJARDY XR 25-05-998 MG TB24 Take 1 tablet by mouth daily.     Melatonin 1 MG CAPS  (Patient not taking: Reported on 03/04/2021)     Methylcellulose, Laxative, (FIBER THERAPY) 500 MG TABS  (Patient not taking: Reported on 03/04/2021)     No current facility-administered medications for this visit.     Allergies:   Metformin hcl and Vancomycin   Social History:  The patient  reports that he quit smoking about 5 years ago. His smoking use included cigarettes. He has a 55.00 pack-year smoking history. He has never used smokeless tobacco. He reports current alcohol use. He reports that he does not use drugs.   Family  History:   family history is not on file.    Review of Systems: Review of Systems  Constitutional: Negative.   HENT: Negative.    Respiratory: Negative.    Cardiovascular: Negative.   Gastrointestinal: Negative.   Musculoskeletal: Negative.   Neurological: Negative.   Psychiatric/Behavioral: Negative.    All other systems reviewed and are negative.   PHYSICAL EXAM: VS:  BP (!) 108/52 (BP Location: Right Arm, Patient Position: Sitting, Cuff Size: Normal)    Pulse 68    Ht 5\' 8"  (1.727 m)    Wt 142 lb 2 oz (64.5 kg)    SpO2 98%    BMI 21.61 kg/m  , BMI Body mass index is 21.61 kg/m. GEN: Well  nourished, well developed, in no acute distress HEENT: normal Neck: no JVD, carotid bruits, or masses Cardiac: RRR; no murmurs, rubs, or gallops,no edema  Respiratory:  clear to auscultation bilaterally, normal work of breathing GI: soft, nontender, nondistended, + BS MS: no deformity or atrophy Skin: warm and dry, no rash Neuro:  Strength and sensation are intact Psych: euthymic mood, full affect  Recent Labs: No results found for requested labs within last 8760 hours.    Lipid Panel No results found for: CHOL, HDL, LDLCALC, TRIG    Wt Readings from Last 3 Encounters:  03/04/21 142 lb 2 oz (64.5 kg)  07/26/13 160 lb 8 oz (72.8 kg)  07/12/13 161 lb 4 oz (73.1 kg)     ASSESSMENT AND PLAN:  Problem List Items Addressed This Visit       Cardiology Problems   Atherosclerotic heart disease of native coronary artery without angina pectoris   Hyperlipidemia     Other   Pulmonary emphysema ([place])   Other Visit Diagnoses     Aortic atherosclerosis ([place])    -  Primary   Former smoker          Cad with stable angina Coronary calcification noted on CT scanning Long discussion with him concerning findings Likely accelerated from high cholesterol, smoking history, diabetes Stressed importance of aggressive diabetes control A1c low 6 range, cholesterol is already at goal Reports little bit of smoking socially but not on a regular basis Denies any anginal symptoms, active, walks his dog, plays golf -We have recommended for any symptoms concerning for angina including chest tightness or shortness of breath that he call our office, stress testing will be ordered.  Stress testing was offered, he has declined at this time.  Recommend he call us at any point if he would like this ordered  Hyperlipidemia LDL at goal, 42 No changes to his medications  Diabetes type 2 with complications Y7W running little bit high low 7 range, reports that he had trouble affording some of his  medications but looking to get back on them Typically he reports A1c running low 6 range  Smoking We have encouraged him to continue to work on weaning his cigarettes and smoking cessation. He will continue to work on this and does not want any assistance with chantix.      Total encounter time more than 60 minutes  Greater than 50% was spent in counseling and coordination of care with the patient    Signed, Esmond Plants, M.D., Ph.D. [place] [place]

## 2021-03-04 NOTE — Patient Instructions (Signed)
Medication Instructions:  No changes  If you need a refill on your cardiac medications before your next appointment, please call your pharmacy.   Lab work: No new labs needed  Testing/Procedures: No new testing needed  Follow-Up: At [facility], you and your health needs are our priority.  As part of our continuing mission to provide you with exceptional heart care, we have created designated Provider Care Teams.  These Care Teams include your primary Cardiologist (physician) and Advanced Practice Providers (APPs -  Physician Assistants and Nurse Practitioners) who all work together to provide you with the care you need, when you need it.  You will need a follow up appointment in 12 months  Providers on your designated Care Team:   Christopher Berge, NP Ryan Dunn, PA-C Cadence Furth, PA-C  COVID-19 Vaccine Information can be found at: [email or web address] For questions related to vaccine distribution or appointments, please email [email or web address] or call [phone].   

## 2021-03-22 ENCOUNTER — Ambulatory Visit: Payer: No Typology Code available for payment source | Admitting: Cardiovascular Disease

## 2021-04-18 DIAGNOSIS — D0472 Carcinoma in situ of skin of left lower limb, including hip: Secondary | ICD-10-CM | POA: Diagnosis not present

## 2021-04-18 DIAGNOSIS — L814 Other melanin hyperpigmentation: Secondary | ICD-10-CM | POA: Diagnosis not present

## 2021-04-18 DIAGNOSIS — L565 Disseminated superficial actinic porokeratosis (DSAP): Secondary | ICD-10-CM | POA: Diagnosis not present

## 2021-04-18 DIAGNOSIS — L821 Other seborrheic keratosis: Secondary | ICD-10-CM | POA: Diagnosis not present

## 2021-04-18 DIAGNOSIS — C44319 Basal cell carcinoma of skin of other parts of face: Secondary | ICD-10-CM | POA: Diagnosis not present

## 2021-04-18 DIAGNOSIS — D1801 Hemangioma of skin and subcutaneous tissue: Secondary | ICD-10-CM | POA: Diagnosis not present

## 2021-04-18 DIAGNOSIS — L720 Epidermal cyst: Secondary | ICD-10-CM | POA: Diagnosis not present

## 2021-04-18 DIAGNOSIS — L570 Actinic keratosis: Secondary | ICD-10-CM | POA: Diagnosis not present

## 2021-05-15 DIAGNOSIS — E785 Hyperlipidemia, unspecified: Secondary | ICD-10-CM | POA: Diagnosis not present

## 2021-05-15 DIAGNOSIS — I2584 Coronary atherosclerosis due to calcified coronary lesion: Secondary | ICD-10-CM | POA: Diagnosis not present

## 2021-05-15 DIAGNOSIS — R634 Abnormal weight loss: Secondary | ICD-10-CM | POA: Diagnosis not present

## 2021-05-15 DIAGNOSIS — E1165 Type 2 diabetes mellitus with hyperglycemia: Secondary | ICD-10-CM | POA: Diagnosis not present

## 2021-05-15 DIAGNOSIS — E1122 Type 2 diabetes mellitus with diabetic chronic kidney disease: Secondary | ICD-10-CM | POA: Diagnosis not present

## 2021-05-15 DIAGNOSIS — J302 Other seasonal allergic rhinitis: Secondary | ICD-10-CM | POA: Diagnosis not present

## 2021-05-26 DIAGNOSIS — K219 Gastro-esophageal reflux disease without esophagitis: Secondary | ICD-10-CM | POA: Diagnosis not present

## 2021-05-26 DIAGNOSIS — M8580 Other specified disorders of bone density and structure, unspecified site: Secondary | ICD-10-CM | POA: Diagnosis not present

## 2021-05-26 DIAGNOSIS — N182 Chronic kidney disease, stage 2 (mild): Secondary | ICD-10-CM | POA: Diagnosis not present

## 2021-05-26 DIAGNOSIS — E1122 Type 2 diabetes mellitus with diabetic chronic kidney disease: Secondary | ICD-10-CM | POA: Diagnosis not present

## 2021-05-29 DIAGNOSIS — C44319 Basal cell carcinoma of skin of other parts of face: Secondary | ICD-10-CM | POA: Diagnosis not present

## 2021-05-29 DIAGNOSIS — Z85828 Personal history of other malignant neoplasm of skin: Secondary | ICD-10-CM | POA: Diagnosis not present

## 2021-06-13 DIAGNOSIS — R634 Abnormal weight loss: Secondary | ICD-10-CM | POA: Diagnosis not present

## 2021-06-14 DIAGNOSIS — R634 Abnormal weight loss: Secondary | ICD-10-CM | POA: Diagnosis not present

## 2021-06-25 DIAGNOSIS — R634 Abnormal weight loss: Secondary | ICD-10-CM | POA: Diagnosis not present

## 2021-07-03 DIAGNOSIS — E785 Hyperlipidemia, unspecified: Secondary | ICD-10-CM | POA: Diagnosis not present

## 2021-07-03 DIAGNOSIS — E1121 Type 2 diabetes mellitus with diabetic nephropathy: Secondary | ICD-10-CM | POA: Diagnosis not present

## 2021-07-03 DIAGNOSIS — R634 Abnormal weight loss: Secondary | ICD-10-CM | POA: Diagnosis not present

## 2021-07-03 DIAGNOSIS — K219 Gastro-esophageal reflux disease without esophagitis: Secondary | ICD-10-CM | POA: Diagnosis not present

## 2021-07-09 DIAGNOSIS — R55 Syncope and collapse: Secondary | ICD-10-CM | POA: Diagnosis not present

## 2021-07-09 DIAGNOSIS — I2510 Atherosclerotic heart disease of native coronary artery without angina pectoris: Secondary | ICD-10-CM | POA: Diagnosis not present

## 2021-07-11 DIAGNOSIS — I2584 Coronary atherosclerosis due to calcified coronary lesion: Secondary | ICD-10-CM | POA: Diagnosis not present

## 2021-07-11 DIAGNOSIS — E1121 Type 2 diabetes mellitus with diabetic nephropathy: Secondary | ICD-10-CM | POA: Diagnosis not present

## 2021-07-11 DIAGNOSIS — E785 Hyperlipidemia, unspecified: Secondary | ICD-10-CM | POA: Diagnosis not present

## 2021-07-12 ENCOUNTER — Other Ambulatory Visit (HOSPITAL_COMMUNITY): Payer: Self-pay

## 2021-07-12 DIAGNOSIS — I2510 Atherosclerotic heart disease of native coronary artery without angina pectoris: Secondary | ICD-10-CM

## 2021-07-12 DIAGNOSIS — R55 Syncope and collapse: Secondary | ICD-10-CM

## 2021-07-18 ENCOUNTER — Ambulatory Visit (HOSPITAL_COMMUNITY)
Admission: RE | Admit: 2021-07-18 | Discharge: 2021-07-18 | Disposition: A | Discharge status: Home / Self Care | Payer: Medicare HMO | Source: Ambulatory Visit

## 2021-07-18 ENCOUNTER — Ambulatory Visit (HOSPITAL_BASED_OUTPATIENT_CLINIC_OR_DEPARTMENT_OTHER)
Admission: RE | Admit: 2021-07-18 | Discharge: 2021-07-18 | Disposition: A | Discharge status: Home / Self Care | Payer: Medicare HMO | Source: Ambulatory Visit

## 2021-07-18 DIAGNOSIS — Z87891 Personal history of nicotine dependence: Secondary | ICD-10-CM | POA: Diagnosis not present

## 2021-07-18 DIAGNOSIS — I2510 Atherosclerotic heart disease of native coronary artery without angina pectoris: Secondary | ICD-10-CM

## 2021-07-18 DIAGNOSIS — E119 Type 2 diabetes mellitus without complications: Secondary | ICD-10-CM | POA: Diagnosis not present

## 2021-07-18 DIAGNOSIS — R55 Syncope and collapse: Secondary | ICD-10-CM

## 2021-07-18 DIAGNOSIS — E785 Hyperlipidemia, unspecified: Secondary | ICD-10-CM | POA: Insufficient documentation

## 2021-07-18 DIAGNOSIS — I6523 Occlusion and stenosis of bilateral carotid arteries: Secondary | ICD-10-CM | POA: Insufficient documentation

## 2021-07-18 LAB — ECHOCARDIOGRAM COMPLETE
Area-P 1/2: 3.36 cm2
S' Lateral: 2.3 cm

## 2021-07-18 NOTE — Progress Notes (Signed)
Carotid artery duplex has been completed. Preliminary results can be found in CV Proc through chart review.   07/18/21 9:01 AM Christian Perkins RVT

## 2021-08-13 DIAGNOSIS — J439 Emphysema, unspecified: Secondary | ICD-10-CM | POA: Diagnosis not present

## 2021-08-13 DIAGNOSIS — I2510 Atherosclerotic heart disease of native coronary artery without angina pectoris: Secondary | ICD-10-CM | POA: Diagnosis not present

## 2021-08-13 DIAGNOSIS — D692 Other nonthrombocytopenic purpura: Secondary | ICD-10-CM | POA: Diagnosis not present

## 2021-08-13 DIAGNOSIS — I700 Atherosclerosis of aorta: Secondary | ICD-10-CM | POA: Diagnosis not present

## 2021-08-13 DIAGNOSIS — E1121 Type 2 diabetes mellitus with diabetic nephropathy: Secondary | ICD-10-CM | POA: Diagnosis not present

## 2021-08-13 DIAGNOSIS — I6523 Occlusion and stenosis of bilateral carotid arteries: Secondary | ICD-10-CM | POA: Diagnosis not present

## 2021-08-29 ENCOUNTER — Encounter: Payer: Self-pay | Admitting: Cardiovascular Disease

## 2021-11-04 DIAGNOSIS — E1121 Type 2 diabetes mellitus with diabetic nephropathy: Secondary | ICD-10-CM | POA: Diagnosis not present

## 2021-11-04 DIAGNOSIS — Z125 Encounter for screening for malignant neoplasm of prostate: Secondary | ICD-10-CM | POA: Diagnosis not present

## 2021-11-07 DIAGNOSIS — I700 Atherosclerosis of aorta: Secondary | ICD-10-CM | POA: Diagnosis not present

## 2021-11-07 DIAGNOSIS — E1121 Type 2 diabetes mellitus with diabetic nephropathy: Secondary | ICD-10-CM | POA: Diagnosis not present

## 2021-11-07 DIAGNOSIS — N529 Male erectile dysfunction, unspecified: Secondary | ICD-10-CM | POA: Diagnosis not present

## 2021-11-07 DIAGNOSIS — Z23 Encounter for immunization: Secondary | ICD-10-CM | POA: Diagnosis not present

## 2021-11-07 DIAGNOSIS — Z0000 Encounter for general adult medical examination without abnormal findings: Secondary | ICD-10-CM | POA: Diagnosis not present

## 2021-11-07 DIAGNOSIS — I2510 Atherosclerotic heart disease of native coronary artery without angina pectoris: Secondary | ICD-10-CM | POA: Diagnosis not present

## 2021-11-07 DIAGNOSIS — K219 Gastro-esophageal reflux disease without esophagitis: Secondary | ICD-10-CM | POA: Diagnosis not present

## 2021-11-07 DIAGNOSIS — J309 Allergic rhinitis, unspecified: Secondary | ICD-10-CM | POA: Diagnosis not present

## 2021-11-07 DIAGNOSIS — D692 Other nonthrombocytopenic purpura: Secondary | ICD-10-CM | POA: Diagnosis not present

## 2021-11-26 DIAGNOSIS — H5213 Myopia, bilateral: Secondary | ICD-10-CM | POA: Diagnosis not present

## 2021-11-26 DIAGNOSIS — Z0100 Encounter for examination of eyes and vision without abnormal findings: Secondary | ICD-10-CM | POA: Diagnosis not present

## 2021-12-31 DIAGNOSIS — H25811 Combined forms of age-related cataract, right eye: Secondary | ICD-10-CM | POA: Diagnosis not present

## 2021-12-31 DIAGNOSIS — H25813 Combined forms of age-related cataract, bilateral: Secondary | ICD-10-CM | POA: Diagnosis not present

## 2021-12-31 DIAGNOSIS — H25812 Combined forms of age-related cataract, left eye: Secondary | ICD-10-CM | POA: Diagnosis not present

## 2022-01-10 DIAGNOSIS — L565 Disseminated superficial actinic porokeratosis (DSAP): Secondary | ICD-10-CM | POA: Diagnosis not present

## 2022-01-10 DIAGNOSIS — D485 Neoplasm of uncertain behavior of skin: Secondary | ICD-10-CM | POA: Diagnosis not present

## 2022-01-10 DIAGNOSIS — Z85828 Personal history of other malignant neoplasm of skin: Secondary | ICD-10-CM | POA: Diagnosis not present

## 2022-01-10 DIAGNOSIS — L570 Actinic keratosis: Secondary | ICD-10-CM | POA: Diagnosis not present

## 2022-01-10 DIAGNOSIS — D1801 Hemangioma of skin and subcutaneous tissue: Secondary | ICD-10-CM | POA: Diagnosis not present

## 2022-01-10 DIAGNOSIS — L720 Epidermal cyst: Secondary | ICD-10-CM | POA: Diagnosis not present

## 2022-01-10 DIAGNOSIS — L578 Other skin changes due to chronic exposure to nonionizing radiation: Secondary | ICD-10-CM | POA: Diagnosis not present

## 2022-01-10 DIAGNOSIS — D225 Melanocytic nevi of trunk: Secondary | ICD-10-CM | POA: Diagnosis not present

## 2022-01-31 ENCOUNTER — Ambulatory Visit
Admission: RE | Admit: 2022-01-31 | Discharge: 2022-01-31 | Disposition: A | Discharge status: Home / Self Care | Payer: Medicare HMO | Source: Ambulatory Visit | Attending: Internal Medicine | Admitting: Internal Medicine

## 2022-01-31 DIAGNOSIS — Z87891 Personal history of nicotine dependence: Secondary | ICD-10-CM | POA: Diagnosis not present

## 2022-01-31 DIAGNOSIS — E785 Hyperlipidemia, unspecified: Secondary | ICD-10-CM | POA: Diagnosis not present

## 2022-01-31 DIAGNOSIS — E1122 Type 2 diabetes mellitus with diabetic chronic kidney disease: Secondary | ICD-10-CM | POA: Diagnosis not present

## 2022-02-03 ENCOUNTER — Other Ambulatory Visit: Payer: Self-pay | Admitting: Acute Care

## 2022-02-03 DIAGNOSIS — Z87891 Personal history of nicotine dependence: Secondary | ICD-10-CM

## 2022-02-03 DIAGNOSIS — Z122 Encounter for screening for malignant neoplasm of respiratory organs: Secondary | ICD-10-CM

## 2022-02-05 ENCOUNTER — Telehealth: Payer: Self-pay | Admitting: Cardiovascular Disease

## 2022-02-05 DIAGNOSIS — I2584 Coronary atherosclerosis due to calcified coronary lesion: Secondary | ICD-10-CM | POA: Diagnosis not present

## 2022-02-05 DIAGNOSIS — E785 Hyperlipidemia, unspecified: Secondary | ICD-10-CM | POA: Diagnosis not present

## 2022-02-05 DIAGNOSIS — E1121 Type 2 diabetes mellitus with diabetic nephropathy: Secondary | ICD-10-CM | POA: Diagnosis not present

## 2022-02-05 NOTE — Telephone Encounter (Signed)
Patient would like for Dr. Rockey Situ to look at some reports. Please call back to discuss

## 2022-02-05 NOTE — Telephone Encounter (Signed)
Spoke with patient and he was wanting Dr. Rockey Situ to review his CT Chest and results showing some aortic atherosclerosis and coronary artery atherosclerosis. Reviewed report he was referring to and also noted he is due for follow up appointment. Scheduled appointment and advised that provider can review with him at that time. He was appreciative for the call back with no further questions at this time.

## 2022-02-06 DIAGNOSIS — H25812 Combined forms of age-related cataract, left eye: Secondary | ICD-10-CM | POA: Diagnosis not present

## 2022-02-06 DIAGNOSIS — H269 Unspecified cataract: Secondary | ICD-10-CM | POA: Diagnosis not present

## 2022-02-06 DIAGNOSIS — H2512 Age-related nuclear cataract, left eye: Secondary | ICD-10-CM | POA: Diagnosis not present

## 2022-02-27 DIAGNOSIS — H2511 Age-related nuclear cataract, right eye: Secondary | ICD-10-CM | POA: Diagnosis not present

## 2022-02-27 DIAGNOSIS — H269 Unspecified cataract: Secondary | ICD-10-CM | POA: Diagnosis not present

## 2022-03-06 DIAGNOSIS — R69 Illness, unspecified: Secondary | ICD-10-CM | POA: Diagnosis not present

## 2022-03-06 DIAGNOSIS — Z961 Presence of intraocular lens: Secondary | ICD-10-CM | POA: Diagnosis not present

## 2022-03-24 NOTE — Progress Notes (Signed)
Cardiology Office Note  Date:  03/25/2022   ID:  Christian, Perkins [birth year], MRN [id number]  PCP:  Christian Pretty, MD   Chief Complaint  Patient presents with   12 month follow up     "Doing well." Medications reviewed by the patient verbally.     HPI:  Mr. Christian Perkins is a 82 year old gentleman with past medical history of Type 2 diabetes with chronic kidney disease Former smoker Aortic atherosclerosis Coronary calcification Hyperlipidemia Emphysema Who presents for f/u of his  coronary calcification, smoking, high cholesterol, diabetes  Last seen by myself in clinic February 2023 Reports feeling well with no significant chest pain or shortness of breath on exertion  Working with primary care, off metformin, on new medication for diabetes Lab work reviewed A1C 7.6 Total chol 123, LDL 66,   Spell 6/23 had loose bowel movements from metformin, had several beers, was at the beach, near syncope episode Workup included echocardiogram and carotid ultrasound with no acute findings He declined workup in the hospital Feels he was dehydrated  No recurrent episodes of near syncope or syncope  EKG personally reviewed by myself on todays visit Sinus bradycardia rate 57 bpm no significant ST-T wave changes  Follows up today to discuss recent screening study ordered by Dr. Shelia Perkins He does report long smoking history in the past, still smokes socially but not on a regular basis as he did 10 years ago  Active, no regular exercise program Denies any significant shortness of breath on exertion, no chest pain concerning for angina Overall reports that he feels well with no complaints Walks his dogs, plays golf with no symptoms  CT coronary calcium scoring November 26, 2020 Images pulled up and reviewed with him Coronary calcium score 931 is at the 74th percentile for the patient's age, sex and race.  Also reviewed CT lung screening study done last month Lipid panel requested  from primary care and reviewed on today's visit Numbers at goal  A1c trending higher, was not taking his medications secondary to cost reports A1c typically runs low 6 range  EKG personally reviewed by myself on todays visit Normal sinus rhythm rate 68 bpm no significant ST-T wave changes   PMH:   has a past medical history of Septic olecranon bursitis of left elbow (06/24/2013).  PSH:    Past Surgical History:  Procedure Laterality Date   APPENDECTOMY     age 73   CATARACT EXTRACTION, BILATERAL Bilateral    I & D EXTREMITY Left 06/24/2013   Procedure: IRRIGATION AND DEBRIDEMENT LEFT ELBOW;  Surgeon: Johnny Bridge, MD;  Location: [place];  Service: Orthopedics;  Laterality: Left;   ROTATOR CUFF REPAIR Left 01/27/1978    Current Outpatient Medications  Medication Sig Dispense Refill   aspirin 81 MG chewable tablet      calcium carbonate 1250 MG capsule Take 1,250 mg by mouth daily.     cholecalciferol (VITAMIN D3) 25 MCG (1000 UNIT) tablet      ezetimibe (ZETIA) 10 MG tablet Take 10 mg by mouth daily.     fluticasone (FLONASE) 50 MCG/ACT nasal spray Place 1 spray into both nostrils daily.     GLYXAMBI 25-5 MG TABS Take 1 tablet by mouth daily.     ipratropium (ATROVENT) 0.06 % nasal spray      Multiple Vitamins-Minerals (PRESERVISION AREDS PO) Take by mouth daily.     omeprazole (PRILOSEC) 20 MG capsule Take 20 mg by mouth daily.  Oyster Shell Calcium 500 MG TABS      rosuvastatin (CRESTOR) 20 MG tablet Take 20 mg by mouth at bedtime.     TRIJARDY XR 25-05-998 MG TB24 Take 1 tablet by mouth daily. (Patient not taking: Reported on 03/25/2022)     No current facility-administered medications for this visit.     Allergies:   Metformin hcl and Vancomycin   Social History:  The patient  reports that he quit smoking about 6 years ago. His smoking use included cigarettes. He has a 55.00 pack-year smoking history. He has never used smokeless tobacco. He reports  current alcohol use. He reports that he does not use drugs.   Family History:   family history is not on file.    Review of Systems: Review of Systems  Constitutional: Negative.   HENT: Negative.    Respiratory: Negative.    Cardiovascular: Negative.   Gastrointestinal: Negative.   Musculoskeletal: Negative.   Neurological: Negative.   Psychiatric/Behavioral: Negative.    All other systems reviewed and are negative.   PHYSICAL EXAM: VS:  BP 118/60 (BP Location: Left Arm, Patient Position: Sitting, Cuff Size: Normal)   Pulse (!) 57   Ht 5' 9"$  (1.753 m)   Wt 143 lb 6 oz (65 kg)   SpO2 98%   BMI 21.17 kg/m  , BMI Body mass index is 21.17 kg/m. Constitutional:  oriented to person, place, and time. No distress.  HENT:  Head: Grossly normal Eyes:  no discharge. No scleral icterus.  Neck: No JVD, no carotid bruits  Cardiovascular: Regular rate and rhythm, no murmurs appreciated Pulmonary/Chest: Clear to auscultation bilaterally, no wheezes or rails Abdominal: Soft.  no distension.  no tenderness.  Musculoskeletal: Normal range of motion Neurological:  normal muscle tone. Coordination normal. No atrophy Skin: Skin warm and dry Psychiatric: normal affect, pleasant  Recent Labs: No results found for requested labs within last 365 days.    Lipid Panel No results found for: "CHOL", "HDL", "LDLCALC", "TRIG"    Wt Readings from Last 3 Encounters:  03/25/22 143 lb 6 oz (65 kg)  03/04/21 142 lb 2 oz (64.5 kg)  07/26/13 160 lb 8 oz (72.8 kg)     ASSESSMENT AND PLAN:  Problem List Items Addressed This Visit       Cardiology Problems   Atherosclerotic heart disease of native coronary artery without angina pectoris   Hyperlipidemia     Other   Pulmonary emphysema ([facility])   Relevant Medications   fluticasone (FLONASE) 50 MCG/ACT nasal spray   Other Visit Diagnoses     Syncope, unspecified syncope type    -  Primary   Aortic atherosclerosis ([place])       Former smoker          Cad with stable angina Coronary calcification noted on CT scanning Images pulled up and reviewed from recent CT scan, heavy LAD calcification Cholesterol at goal, stressed the importance of aggressive diabetes control, He does have prior smoking history Denies chest pain or shortness of breath concerning for angina  Hyperlipidemia LDL at goal, <70 No changes to his medications, continue Zetia with Crestor  Diabetes type 2 with complications [id number] running greater than 7 Recommend he work closely with primary care  Smoking Reports current non-smoker    Total encounter time more than 30 minutes  Greater than 50% was spent in counseling and coordination of care with the patient    Signed, Esmond Plants, M.D., Ph.D. [employer]  [phone] 

## 2022-03-25 ENCOUNTER — Ambulatory Visit: Payer: Medicare HMO | Admitting: Cardiovascular Disease

## 2022-03-25 ENCOUNTER — Ambulatory Visit: Payer: Medicare HMO | Attending: Cardiovascular Disease | Admitting: Cardiovascular Disease

## 2022-03-25 ENCOUNTER — Encounter: Payer: Self-pay | Admitting: Cardiovascular Disease

## 2022-03-25 VITALS — BP 118/60 | HR 57 | Ht 69.0 in | Wt 143.4 lb

## 2022-03-25 DIAGNOSIS — R55 Syncope and collapse: Secondary | ICD-10-CM | POA: Diagnosis not present

## 2022-03-25 DIAGNOSIS — Z87891 Personal history of nicotine dependence: Secondary | ICD-10-CM

## 2022-03-25 DIAGNOSIS — J432 Centrilobular emphysema: Secondary | ICD-10-CM

## 2022-03-25 DIAGNOSIS — E782 Mixed hyperlipidemia: Secondary | ICD-10-CM

## 2022-03-25 DIAGNOSIS — I700 Atherosclerosis of aorta: Secondary | ICD-10-CM | POA: Diagnosis not present

## 2022-03-25 DIAGNOSIS — I2510 Atherosclerotic heart disease of native coronary artery without angina pectoris: Secondary | ICD-10-CM | POA: Diagnosis not present

## 2022-03-25 NOTE — Patient Instructions (Signed)
Medication Instructions:  No changes  If you need a refill on your cardiac medications before your next appointment, please call your pharmacy.   Lab work: No new labs needed  Testing/Procedures: No new testing needed  Follow-Up: At [facility], you and your health needs are our priority.  As part of our continuing mission to provide you with exceptional heart care, we have created designated Provider Care Teams.  These Care Teams include your primary Cardiologist (physician) and Advanced Practice Providers (APPs -  Physician Assistants and Nurse Practitioners) who all work together to provide you with the care you need, when you need it.  You will need a follow up appointment in 12 months  Providers on your designated Care Team:   Christopher Berge, NP Ryan Dunn, PA-C Cadence Furth, PA-C  COVID-19 Vaccine Information can be found at: [email or web address] For questions related to vaccine distribution or appointments, please email [email or web address] or call [phone].   

## 2022-05-07 DIAGNOSIS — E1122 Type 2 diabetes mellitus with diabetic chronic kidney disease: Secondary | ICD-10-CM | POA: Diagnosis not present

## 2022-05-07 DIAGNOSIS — K529 Noninfective gastroenteritis and colitis, unspecified: Secondary | ICD-10-CM | POA: Diagnosis not present

## 2022-05-07 DIAGNOSIS — I2584 Coronary atherosclerosis due to calcified coronary lesion: Secondary | ICD-10-CM | POA: Diagnosis not present

## 2022-05-07 DIAGNOSIS — E785 Hyperlipidemia, unspecified: Secondary | ICD-10-CM | POA: Diagnosis not present

## 2022-07-09 DIAGNOSIS — D1801 Hemangioma of skin and subcutaneous tissue: Secondary | ICD-10-CM | POA: Diagnosis not present

## 2022-07-09 DIAGNOSIS — D692 Other nonthrombocytopenic purpura: Secondary | ICD-10-CM | POA: Diagnosis not present

## 2022-07-09 DIAGNOSIS — Z85828 Personal history of other malignant neoplasm of skin: Secondary | ICD-10-CM | POA: Diagnosis not present

## 2022-07-09 DIAGNOSIS — L570 Actinic keratosis: Secondary | ICD-10-CM | POA: Diagnosis not present

## 2022-07-09 DIAGNOSIS — L821 Other seborrheic keratosis: Secondary | ICD-10-CM | POA: Diagnosis not present

## 2022-07-09 DIAGNOSIS — L565 Disseminated superficial actinic porokeratosis (DSAP): Secondary | ICD-10-CM | POA: Diagnosis not present

## 2022-07-09 DIAGNOSIS — L920 Granuloma annulare: Secondary | ICD-10-CM | POA: Diagnosis not present

## 2022-08-12 DIAGNOSIS — K529 Noninfective gastroenteritis and colitis, unspecified: Secondary | ICD-10-CM | POA: Diagnosis not present

## 2022-08-12 DIAGNOSIS — E1122 Type 2 diabetes mellitus with diabetic chronic kidney disease: Secondary | ICD-10-CM | POA: Diagnosis not present

## 2022-08-12 DIAGNOSIS — E785 Hyperlipidemia, unspecified: Secondary | ICD-10-CM | POA: Diagnosis not present

## 2022-08-12 DIAGNOSIS — I2584 Coronary atherosclerosis due to calcified coronary lesion: Secondary | ICD-10-CM | POA: Diagnosis not present

## 2022-09-21 IMAGING — CT CT CHEST LUNG CANCER SCREENING LOW DOSE W/O CM
1 of 2 series · 10 of 20 positions shown, 13 images · non-contrast
Comparison: None.

CLINICAL DATA: 79-year-old male with 55 pack-year history of
smoking. Lung cancer screening.

EXAM:
CT CHEST WITHOUT CONTRAST LOW-DOSE FOR LUNG CANCER SCREENING
TECHNIQUE: Multidetector CT imaging of the chest was performed following the
standard protocol without IV contrast.

[ct lung segmentation data · axial · 0.75mm/px · z∈[-409,-409]mm · 10 of 307 frames shown]
[frame 1/307  mediastinal]
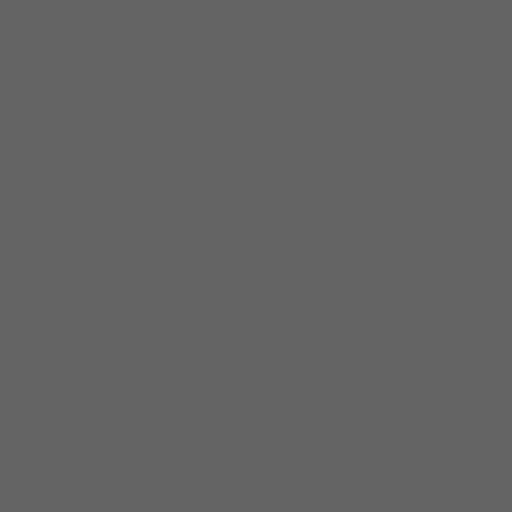
[frame 1/307  lung]
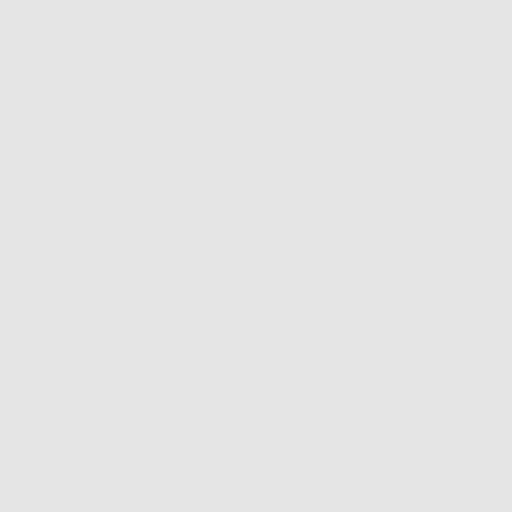
[frame 35/307  lung]
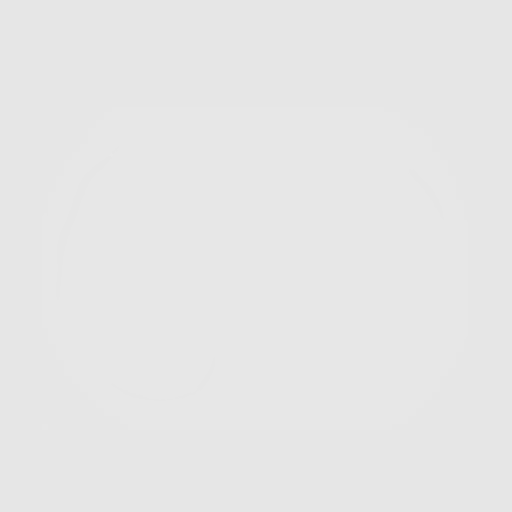
[frame 69/307  lung]
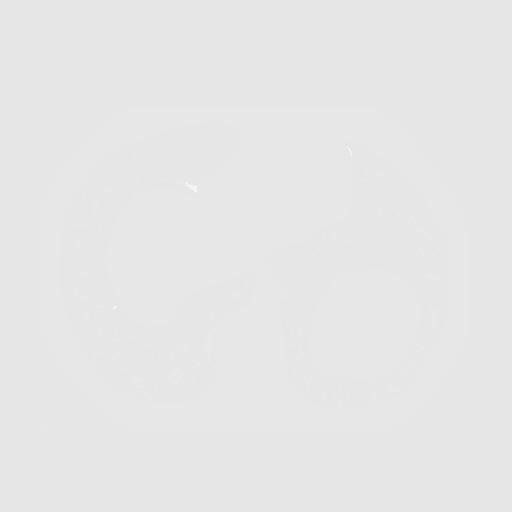
[frame 103/307  lung]
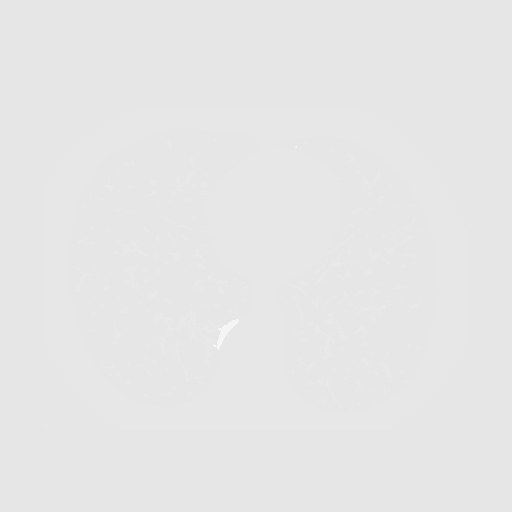
[frame 137/307  mediastinal]
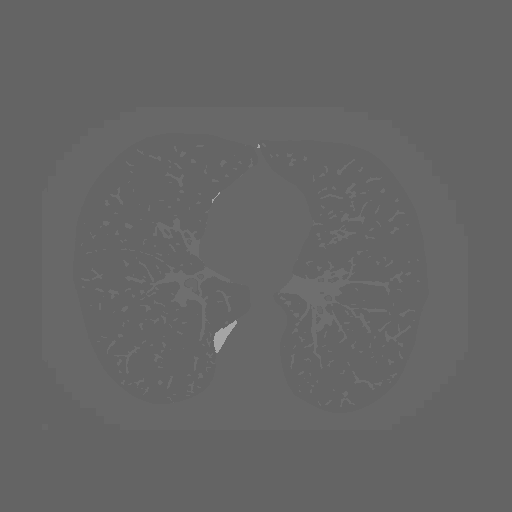
[frame 137/307  lung]
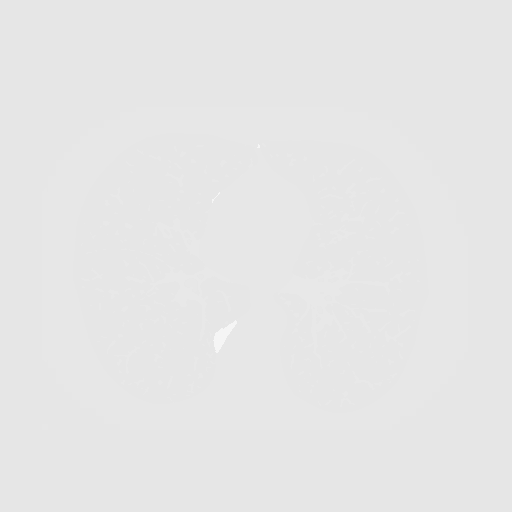
[frame 171/307  lung]
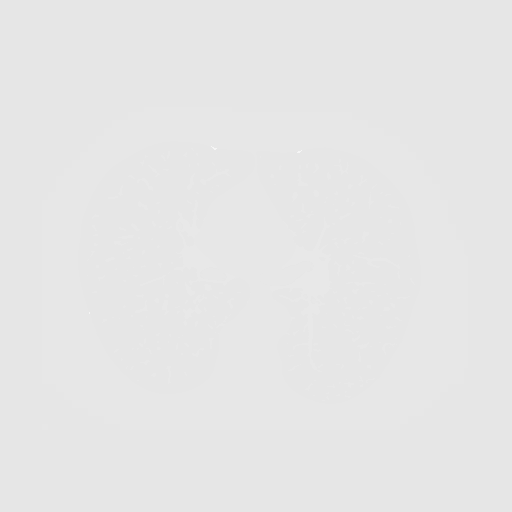
[frame 205/307  lung]
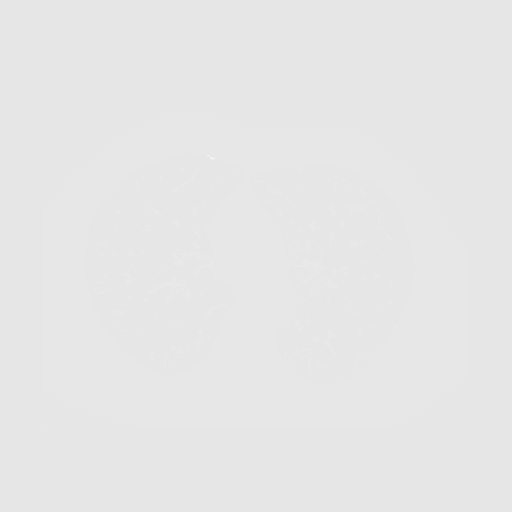
[frame 239/307  lung]
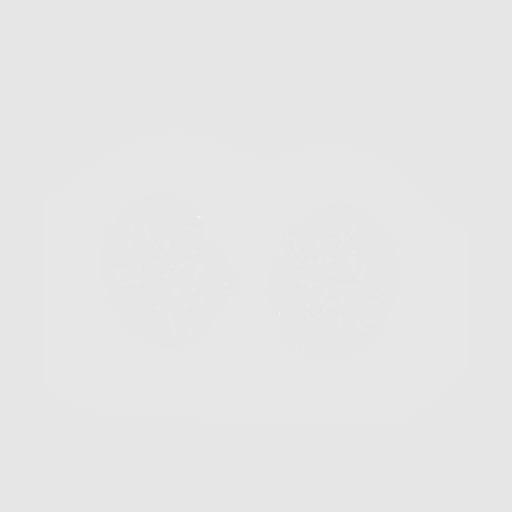
[frame 273/307  mediastinal]
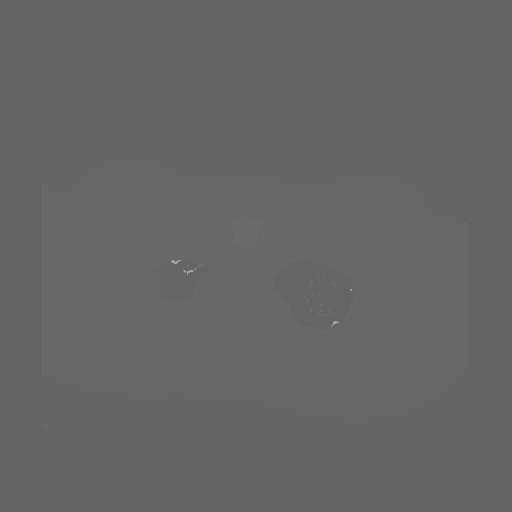
[frame 273/307  lung]
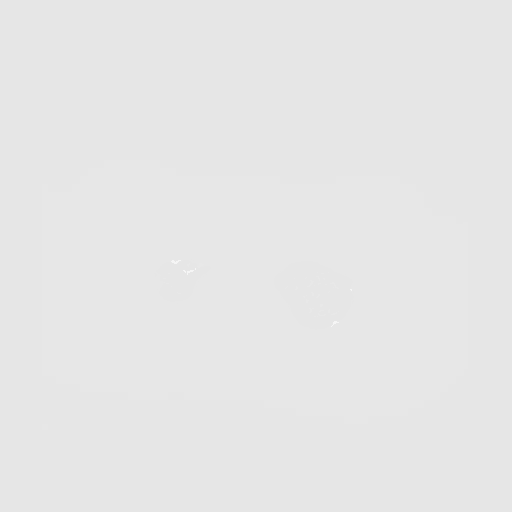
[frame 307/307  lung]
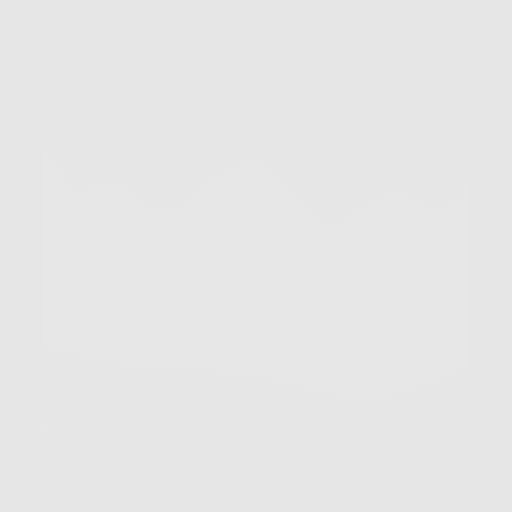

[10 of 20 positions shown; findings below may reference images not displayed]

FINDINGS: Cardiovascular: The heart size is normal. No substantial pericardial
effusion. Coronary artery calcification is evident. Atherosclerotic
calcification is noted in the wall of the thoracic aorta.

Mediastinum/Nodes: No mediastinal lymphadenopathy. No evidence for
gross hilar lymphadenopathy although assessment is limited by the
lack of intravenous contrast on today's study. The esophagus has
normal imaging features. There is no axillary lymphadenopathy.

Lungs/Pleura: Centrilobular emphsyema noted. Previously identified
pulmonary nodules are stable. No new suspicious pulmonary nodule or
mass. Scattered areas of peripheral small airway impaction noted
bilaterally. No focal airspace consolidation. No pleural effusion.

Upper Abdomen: Unremarkable.

Musculoskeletal: No worrisome lytic or sclerotic osseous
abnormality.
IMPRESSION: 1. Lung-RADS 2, benign appearance or behavior. Continue annual
screening with low-dose chest CT without contrast in 12 months.
2. Aortic Atherosclerosis (Z4E76-KLN.N) and Emphysema (Z4E76-GPF.A).

## 2022-10-30 DIAGNOSIS — E785 Hyperlipidemia, unspecified: Secondary | ICD-10-CM | POA: Diagnosis not present

## 2022-10-30 DIAGNOSIS — E1122 Type 2 diabetes mellitus with diabetic chronic kidney disease: Secondary | ICD-10-CM | POA: Diagnosis not present

## 2022-10-30 DIAGNOSIS — I2584 Coronary atherosclerosis due to calcified coronary lesion: Secondary | ICD-10-CM | POA: Diagnosis not present

## 2022-11-12 DIAGNOSIS — M8580 Other specified disorders of bone density and structure, unspecified site: Secondary | ICD-10-CM | POA: Diagnosis not present

## 2022-11-12 DIAGNOSIS — I700 Atherosclerosis of aorta: Secondary | ICD-10-CM | POA: Diagnosis not present

## 2022-11-12 DIAGNOSIS — J432 Centrilobular emphysema: Secondary | ICD-10-CM | POA: Diagnosis not present

## 2022-11-12 DIAGNOSIS — Z125 Encounter for screening for malignant neoplasm of prostate: Secondary | ICD-10-CM | POA: Diagnosis not present

## 2022-11-12 DIAGNOSIS — I2510 Atherosclerotic heart disease of native coronary artery without angina pectoris: Secondary | ICD-10-CM | POA: Diagnosis not present

## 2022-11-12 DIAGNOSIS — Z0000 Encounter for general adult medical examination without abnormal findings: Secondary | ICD-10-CM | POA: Diagnosis not present

## 2022-11-12 DIAGNOSIS — E1121 Type 2 diabetes mellitus with diabetic nephropathy: Secondary | ICD-10-CM | POA: Diagnosis not present

## 2022-11-12 DIAGNOSIS — Z23 Encounter for immunization: Secondary | ICD-10-CM | POA: Diagnosis not present

## 2022-11-12 DIAGNOSIS — I6523 Occlusion and stenosis of bilateral carotid arteries: Secondary | ICD-10-CM | POA: Diagnosis not present

## 2022-11-12 DIAGNOSIS — Z1212 Encounter for screening for malignant neoplasm of rectum: Secondary | ICD-10-CM | POA: Diagnosis not present

## 2022-11-12 DIAGNOSIS — K219 Gastro-esophageal reflux disease without esophagitis: Secondary | ICD-10-CM | POA: Diagnosis not present

## 2022-11-13 DIAGNOSIS — H5213 Myopia, bilateral: Secondary | ICD-10-CM | POA: Diagnosis not present

## 2023-01-07 ENCOUNTER — Other Ambulatory Visit: Payer: Self-pay | Admitting: Acute Care

## 2023-01-07 DIAGNOSIS — L821 Other seborrheic keratosis: Secondary | ICD-10-CM | POA: Diagnosis not present

## 2023-01-07 DIAGNOSIS — L720 Epidermal cyst: Secondary | ICD-10-CM | POA: Diagnosis not present

## 2023-01-07 DIAGNOSIS — L814 Other melanin hyperpigmentation: Secondary | ICD-10-CM | POA: Diagnosis not present

## 2023-01-07 DIAGNOSIS — Z122 Encounter for screening for malignant neoplasm of respiratory organs: Secondary | ICD-10-CM

## 2023-01-07 DIAGNOSIS — L570 Actinic keratosis: Secondary | ICD-10-CM | POA: Diagnosis not present

## 2023-01-07 DIAGNOSIS — D485 Neoplasm of uncertain behavior of skin: Secondary | ICD-10-CM | POA: Diagnosis not present

## 2023-01-07 DIAGNOSIS — L565 Disseminated superficial actinic porokeratosis (DSAP): Secondary | ICD-10-CM | POA: Diagnosis not present

## 2023-01-07 DIAGNOSIS — Z87891 Personal history of nicotine dependence: Secondary | ICD-10-CM

## 2023-01-07 DIAGNOSIS — Z85828 Personal history of other malignant neoplasm of skin: Secondary | ICD-10-CM | POA: Diagnosis not present

## 2023-02-05 ENCOUNTER — Ambulatory Visit
Admission: RE | Admit: 2023-02-05 | Discharge: 2023-02-05 | Disposition: A | Discharge status: Home / Self Care | Payer: Medicare Other | Source: Ambulatory Visit | Attending: Acute Care | Admitting: Acute Care

## 2023-02-05 DIAGNOSIS — Z122 Encounter for screening for malignant neoplasm of respiratory organs: Secondary | ICD-10-CM | POA: Diagnosis not present

## 2023-02-05 DIAGNOSIS — Z87891 Personal history of nicotine dependence: Secondary | ICD-10-CM | POA: Diagnosis not present

## 2023-02-10 DIAGNOSIS — E785 Hyperlipidemia, unspecified: Secondary | ICD-10-CM | POA: Diagnosis not present

## 2023-02-10 DIAGNOSIS — E1121 Type 2 diabetes mellitus with diabetic nephropathy: Secondary | ICD-10-CM | POA: Diagnosis not present

## 2023-02-12 DIAGNOSIS — M8589 Other specified disorders of bone density and structure, multiple sites: Secondary | ICD-10-CM | POA: Diagnosis not present

## 2023-02-12 DIAGNOSIS — E1121 Type 2 diabetes mellitus with diabetic nephropathy: Secondary | ICD-10-CM | POA: Diagnosis not present

## 2023-02-12 DIAGNOSIS — M8580 Other specified disorders of bone density and structure, unspecified site: Secondary | ICD-10-CM | POA: Diagnosis not present

## 2023-02-12 DIAGNOSIS — E785 Hyperlipidemia, unspecified: Secondary | ICD-10-CM | POA: Diagnosis not present

## 2023-02-26 DIAGNOSIS — R42 Dizziness and giddiness: Secondary | ICD-10-CM | POA: Diagnosis not present

## 2023-02-26 DIAGNOSIS — E1121 Type 2 diabetes mellitus with diabetic nephropathy: Secondary | ICD-10-CM | POA: Diagnosis not present

## 2023-04-15 DIAGNOSIS — H43392 Other vitreous opacities, left eye: Secondary | ICD-10-CM | POA: Diagnosis not present

## 2023-05-19 DIAGNOSIS — E1121 Type 2 diabetes mellitus with diabetic nephropathy: Secondary | ICD-10-CM | POA: Diagnosis not present

## 2023-05-19 DIAGNOSIS — H26493 Other secondary cataract, bilateral: Secondary | ICD-10-CM | POA: Diagnosis not present

## 2023-05-19 DIAGNOSIS — E785 Hyperlipidemia, unspecified: Secondary | ICD-10-CM | POA: Diagnosis not present

## 2023-05-21 DIAGNOSIS — E785 Hyperlipidemia, unspecified: Secondary | ICD-10-CM | POA: Diagnosis not present

## 2023-05-21 DIAGNOSIS — E1121 Type 2 diabetes mellitus with diabetic nephropathy: Secondary | ICD-10-CM | POA: Diagnosis not present

## 2023-05-21 DIAGNOSIS — I2584 Coronary atherosclerosis due to calcified coronary lesion: Secondary | ICD-10-CM | POA: Diagnosis not present

## 2023-05-24 NOTE — Progress Notes (Unsigned)
 Cardiology Office Note  Date:  05/25/2023   ID:  Elridge, Reasner 23-Jul-1940, MRN [id number]  PCP:  Imelda Man, MD   Chief Complaint  Patient presents with   12 month follow up     "Doing well."    HPI:  Mr. Christian Perkins is a 83 year old gentleman with past medical history of Type 2 diabetes with chronic kidney disease Former smoker Aortic atherosclerosis Coronary calcification Hyperlipidemia Emphysema Coronary calcium score 931 Who presents for f/u of his  coronary calcification, smoking, high cholesterol, diabetes  Last seen by myself in clinic February 2024  Dizzy in the Am,  felt it was dehydration Symptoms resolved Reports A1c running little bit high 8 Primary care managing diabetes  No chest pain or shortness of breath on exertion Likes to play golf  Lab work reviewed A1C 8.0 Total chol 122, LDL 58  CT coronary calcium scoring November 26, 2020 Images pulled up and reviewed with him Coronary calcium score 931 is at the 74th percentile for the patient's age, sex and race.   PMH:   has a past medical history of Septic olecranon bursitis of left elbow (06/24/2013).  PSH:    Past Surgical History:  Procedure Laterality Date   APPENDECTOMY     age 25   CATARACT EXTRACTION, BILATERAL Bilateral    I & D EXTREMITY Left 06/24/2013   Procedure: IRRIGATION AND DEBRIDEMENT LEFT ELBOW;  Surgeon: Neville Barbone, MD;  Location: [facility];  Service: Orthopedics;  Laterality: Left;   ROTATOR CUFF REPAIR Left 01/27/1978    Current Outpatient Medications  Medication Sig Dispense Refill   aspirin 81 MG chewable tablet      calcium carbonate 1250 MG capsule Take 1,250 mg by mouth daily.     cholecalciferol (VITAMIN D3) 25 MCG (1000 UNIT) tablet      ezetimibe (ZETIA) 10 MG tablet Take 10 mg by mouth daily.     fluticasone (FLONASE) 50 MCG/ACT nasal spray Place 1 spray into both nostrils daily.     GLYXAMBI 25-5 MG TABS Take 1 tablet by mouth  daily.     ipratropium (ATROVENT) 0.06 % nasal spray      metFORMIN (GLUCOPHAGE-XR) 500 MG 24 hr tablet Take 500 mg by mouth 2 (two) times daily with a meal.     Multiple Vitamins-Minerals (PRESERVISION AREDS PO) Take by mouth daily.     omeprazole (PRILOSEC) 20 MG capsule Take 20 mg by mouth daily.     Oyster Shell Calcium 500 MG TABS      rosuvastatin (CRESTOR) 20 MG tablet Take 20 mg by mouth at bedtime.     TRIJARDY XR 25-05-998 MG TB24 Take 1 tablet by mouth daily. (Patient not taking: Reported on 05/25/2023)     No current facility-administered medications for this visit.     Allergies:   Metformin hcl and Vancomycin    Social History:  The patient  reports that he quit smoking about 7 years ago. His smoking use included cigarettes. He started smoking about 62 years ago. He has a 55 pack-year smoking history. He has never used smokeless tobacco. He reports current alcohol use. He reports that he does not use drugs.   Family History:   family history is not on file.    Review of Systems: Review of Systems  Constitutional: Negative.   HENT: Negative.    Respiratory: Negative.    Cardiovascular: Negative.   Gastrointestinal: Negative.   Musculoskeletal: Negative.   Neurological: Negative.  Psychiatric/Behavioral: Negative.    All other systems reviewed and are negative.   PHYSICAL EXAM: VS:  BP 128/60 (BP Location: Left Arm, Patient Position: Sitting, Cuff Size: Normal)   Pulse 69   Ht 5\' 8"  (1.727 m)   Wt 146 lb (66.2 kg)   SpO2 98%   BMI 22.20 kg/m  , BMI Body mass index is 22.2 kg/m. Constitutional:  oriented to person, place, and time. No distress.  HENT:  Head: Grossly normal Eyes:  no discharge. No scleral icterus.  Neck: No JVD, no carotid bruits  Cardiovascular: Regular rate and rhythm, no murmurs appreciated Pulmonary/Chest: Clear to auscultation bilaterally, no wheezes or rales Abdominal: Soft.  no distension.  no tenderness.  Musculoskeletal: Normal  range of motion Neurological:  normal muscle tone. Coordination normal. No atrophy Skin: Skin warm and dry Psychiatric: normal affect, pleasant  Recent Labs: No results found for requested labs within last 365 days.  Total chol 122, LDL 58  Lipid Panel No results found for: "CHOL", "HDL", "LDLCALC", "TRIG"    Wt Readings from Last 3 Encounters:  05/25/23 146 lb (66.2 kg)  03/25/22 143 lb 6 oz (65 kg)  03/04/21 142 lb 2 oz (64.5 kg)     ASSESSMENT AND PLAN:  Problem List Items Addressed This Visit       Cardiology Problems   Atherosclerotic heart disease of native coronary artery without angina pectoris - Primary   Relevant Orders   EKG 12-Lead (Completed)   Hyperlipidemia     Other   Chronic kidney disease, stage 2 (mild)   Relevant Orders   EKG 12-Lead (Completed)   Diabetic renal disease (HCC)   Relevant Medications   metFORMIN (GLUCOPHAGE-XR) 500 MG 24 hr tablet   Other Relevant Orders   EKG 12-Lead (Completed)   Pulmonary emphysema (HCC)   Other Visit Diagnoses       Syncope, unspecified syncope type         Aortic atherosclerosis (HCC)       Relevant Orders   EKG 12-Lead (Completed)     Former smoker          Cad with stable angina Coronary calcification noted on CT scanning heavy LAD calcification Currently with no symptoms of angina. No further workup at this time. Continue current medication regimen.  Cholesterol at goal, stressed the importance of aggressive diabetes control, He does have prior smoking history Cholesterol at goal on Zetia and Crestor  Hyperlipidemia LDL at goal, <70  continue Zetia with Crestor  Diabetes type 2 with complications A1c running 8,  On medications per primary care  Smoking Reports current non-smoker    Signed, Juanda Noon, M.D., Ph.D. [facility] [place], [place] [phone]

## 2023-05-25 ENCOUNTER — Ambulatory Visit: Payer: Medicare Other | Attending: Cardiovascular Disease | Admitting: Cardiovascular Disease

## 2023-05-25 VITALS — BP 128/60 | HR 69 | Ht 68.0 in | Wt 146.0 lb

## 2023-05-25 DIAGNOSIS — I700 Atherosclerosis of aorta: Secondary | ICD-10-CM

## 2023-05-25 DIAGNOSIS — N182 Chronic kidney disease, stage 2 (mild): Secondary | ICD-10-CM | POA: Diagnosis not present

## 2023-05-25 DIAGNOSIS — E1321 Other specified diabetes mellitus with diabetic nephropathy: Secondary | ICD-10-CM | POA: Diagnosis not present

## 2023-05-25 DIAGNOSIS — I2510 Atherosclerotic heart disease of native coronary artery without angina pectoris: Secondary | ICD-10-CM | POA: Diagnosis not present

## 2023-05-25 DIAGNOSIS — Z87891 Personal history of nicotine dependence: Secondary | ICD-10-CM | POA: Diagnosis not present

## 2023-05-25 DIAGNOSIS — E782 Mixed hyperlipidemia: Secondary | ICD-10-CM

## 2023-05-25 DIAGNOSIS — R55 Syncope and collapse: Secondary | ICD-10-CM | POA: Diagnosis not present

## 2023-05-25 DIAGNOSIS — J432 Centrilobular emphysema: Secondary | ICD-10-CM | POA: Diagnosis not present

## 2023-05-25 NOTE — Patient Instructions (Signed)

## 2023-07-08 DIAGNOSIS — L565 Disseminated superficial actinic porokeratosis (DSAP): Secondary | ICD-10-CM | POA: Diagnosis not present

## 2023-07-08 DIAGNOSIS — L821 Other seborrheic keratosis: Secondary | ICD-10-CM | POA: Diagnosis not present

## 2023-07-08 DIAGNOSIS — L814 Other melanin hyperpigmentation: Secondary | ICD-10-CM | POA: Diagnosis not present

## 2023-07-08 DIAGNOSIS — D692 Other nonthrombocytopenic purpura: Secondary | ICD-10-CM | POA: Diagnosis not present

## 2023-07-08 DIAGNOSIS — D1801 Hemangioma of skin and subcutaneous tissue: Secondary | ICD-10-CM | POA: Diagnosis not present

## 2023-07-08 DIAGNOSIS — L570 Actinic keratosis: Secondary | ICD-10-CM | POA: Diagnosis not present

## 2023-07-08 DIAGNOSIS — Z85828 Personal history of other malignant neoplasm of skin: Secondary | ICD-10-CM | POA: Diagnosis not present

## 2023-07-08 DIAGNOSIS — L920 Granuloma annulare: Secondary | ICD-10-CM | POA: Diagnosis not present

## 2023-07-08 DIAGNOSIS — C44311 Basal cell carcinoma of skin of nose: Secondary | ICD-10-CM | POA: Diagnosis not present

## 2023-09-08 DIAGNOSIS — C44311 Basal cell carcinoma of skin of nose: Secondary | ICD-10-CM | POA: Diagnosis not present

## 2023-09-10 DIAGNOSIS — H11152 Pinguecula, left eye: Secondary | ICD-10-CM | POA: Diagnosis not present

## 2023-09-10 DIAGNOSIS — E1121 Type 2 diabetes mellitus with diabetic nephropathy: Secondary | ICD-10-CM | POA: Diagnosis not present

## 2023-09-10 DIAGNOSIS — J302 Other seasonal allergic rhinitis: Secondary | ICD-10-CM | POA: Diagnosis not present

## 2023-09-10 DIAGNOSIS — E1122 Type 2 diabetes mellitus with diabetic chronic kidney disease: Secondary | ICD-10-CM | POA: Diagnosis not present

## 2023-09-10 DIAGNOSIS — Z85828 Personal history of other malignant neoplasm of skin: Secondary | ICD-10-CM | POA: Diagnosis not present

## 2023-09-10 DIAGNOSIS — E785 Hyperlipidemia, unspecified: Secondary | ICD-10-CM | POA: Diagnosis not present

## 2023-09-10 DIAGNOSIS — I2584 Coronary atherosclerosis due to calcified coronary lesion: Secondary | ICD-10-CM | POA: Diagnosis not present

## 2023-10-25 IMAGING — CT CT CHEST LUNG CANCER SCREENING LOW DOSE W/O CM
1 series · 10 of 10 positions shown, 13 images · non-contrast
Comparison: 12/29/2019.

CLINICAL DATA: Former smoker, quit September 2017, 55 pack-year
history.

EXAM:
CT CHEST WITHOUT CONTRAST LOW-DOSE FOR LUNG CANCER SCREENING
TECHNIQUE: Multidetector CT imaging of the chest was performed following the
standard protocol without IV contrast.

[ct lung segmentation data · axial · 0.75mm/px · z∈[-326,-326]mm · 10 of 320 frames shown]
[frame 1/320  mediastinal]
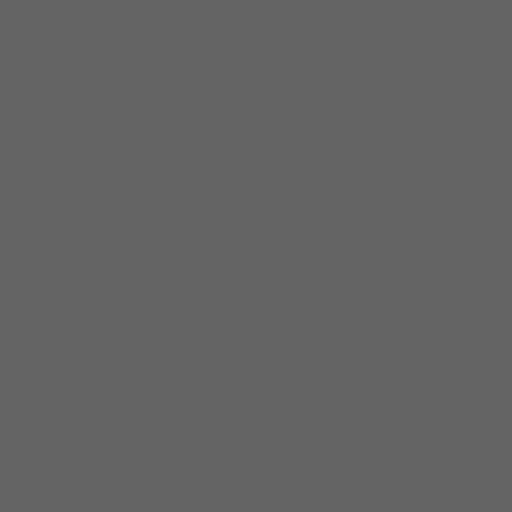
[frame 1/320  lung]
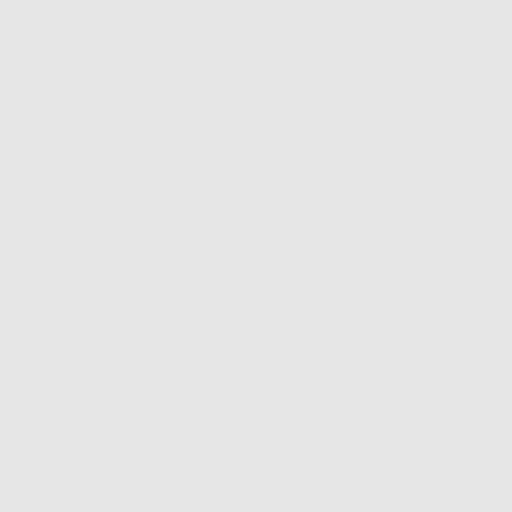
[frame 36/320  lung]
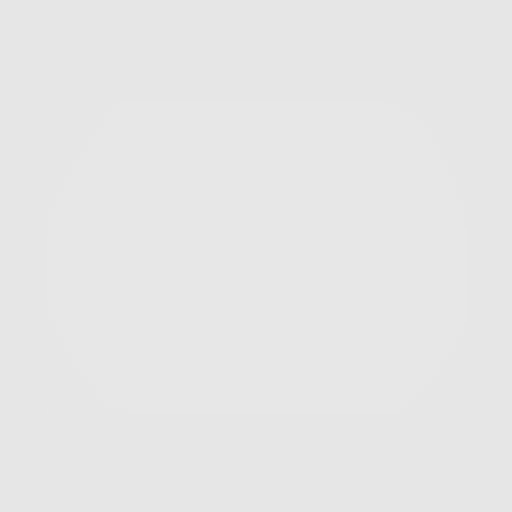
[frame 71/320  lung]
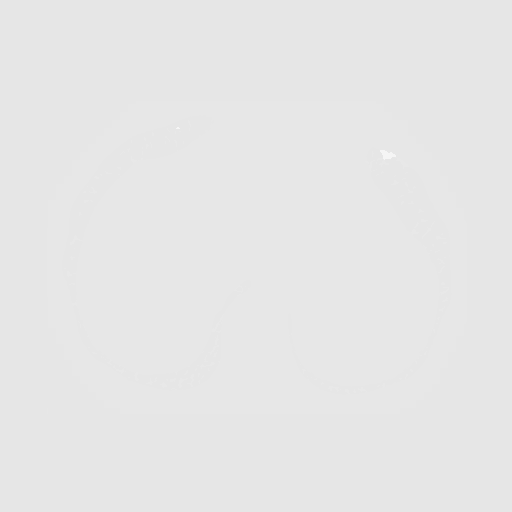
[frame 107/320  lung]
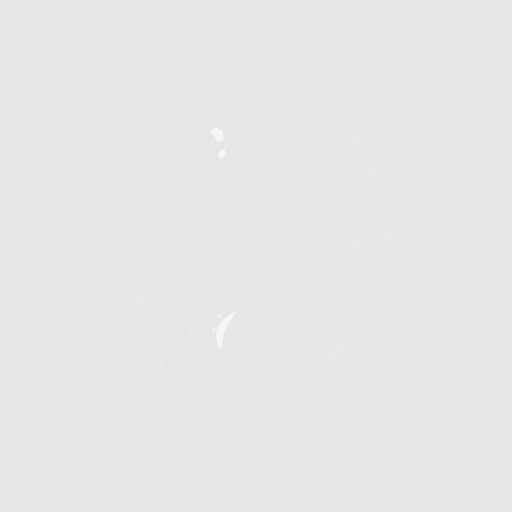
[frame 142/320  mediastinal]
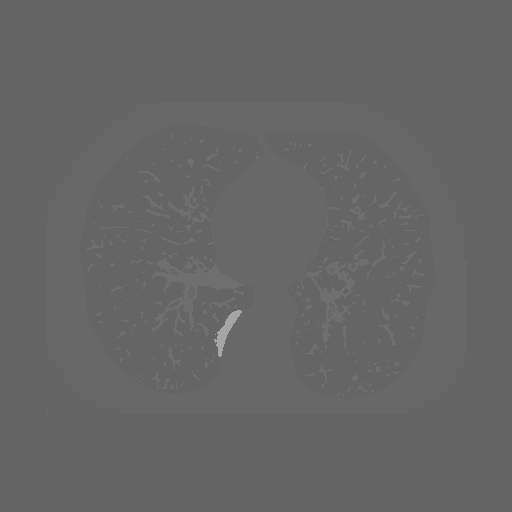
[frame 142/320  lung]
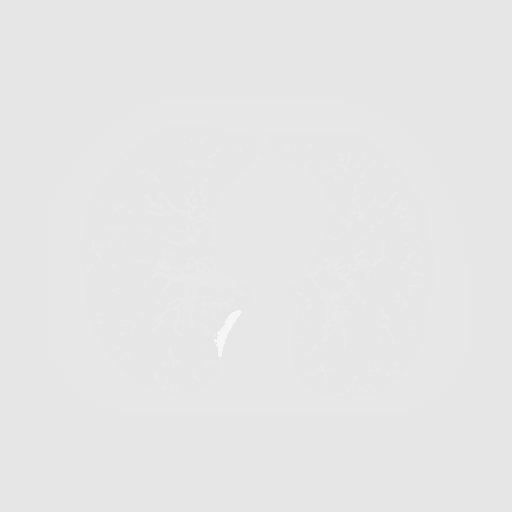
[frame 178/320  lung]
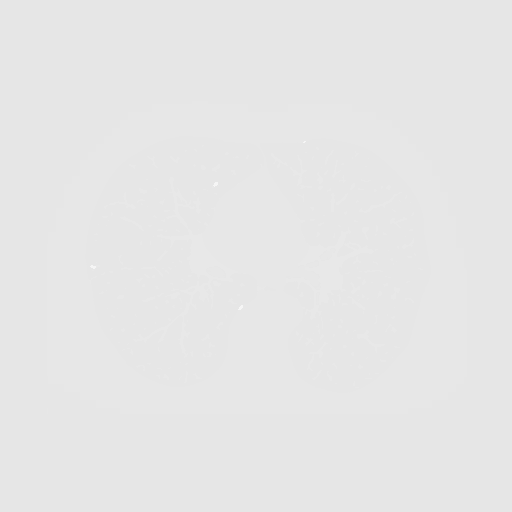
[frame 213/320  lung]
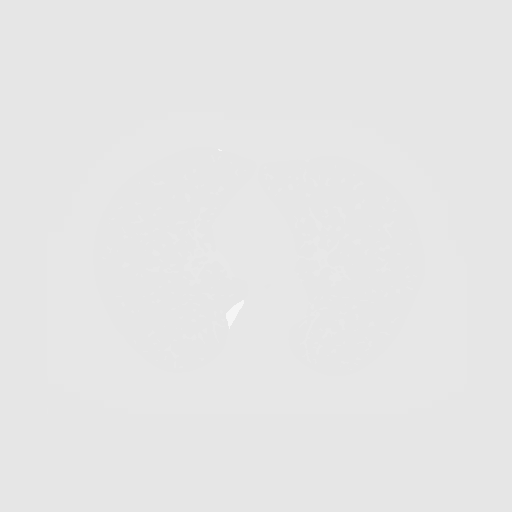
[frame 249/320  lung]
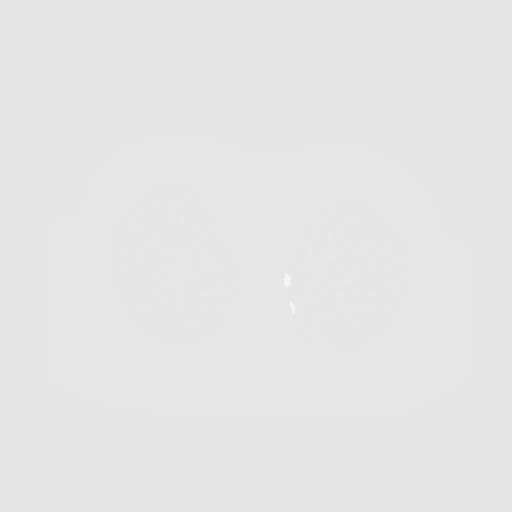
[frame 284/320  mediastinal]
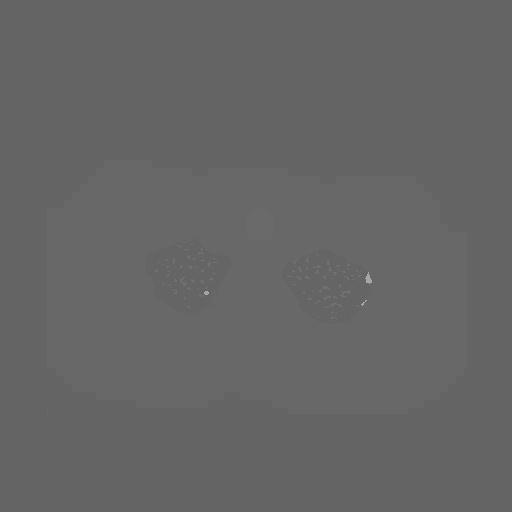
[frame 284/320  lung]
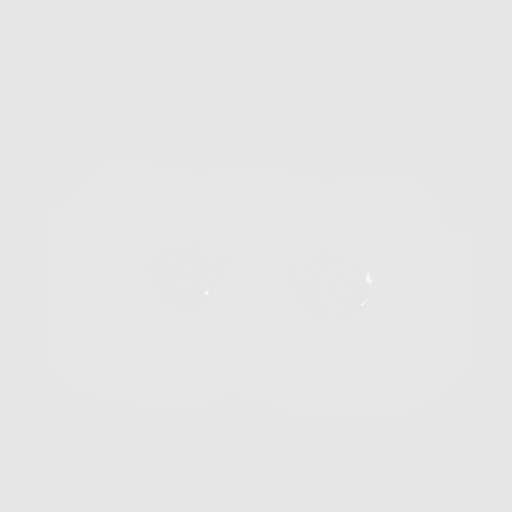
[frame 320/320  lung]
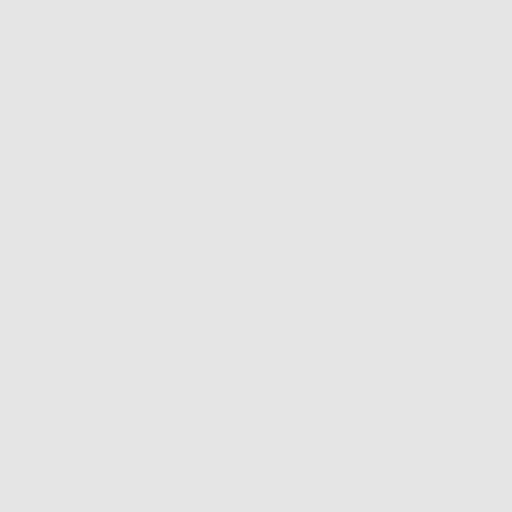

[10 of 10 positions shown; findings below may reference images not displayed]

FINDINGS: Cardiovascular: Atherosclerotic calcification of the aorta, aortic
valve and coronary arteries. Heart size normal. No pericardial
effusion.

Mediastinum/Nodes: No pathologically enlarged mediastinal or
axillary lymph nodes. Hilar regions are difficult to definitively
evaluate without IV contrast but appear grossly unremarkable.
Esophagus is grossly unremarkable.

Lungs/Pleura: Biapical pleuroparenchymal scarring. Centrilobular and
paraseptal emphysema. New scattered peribronchovascular nodularity,
likely infectious or postinfectious in etiology. Pulmonary nodules
measure 5.5 mm or less in size, as before. No pleural fluid.
Scattered mucoid impaction. Minimal adherent debris in the airway.

Upper Abdomen: Visualized portions of the liver, gallbladder,
adrenal glands, kidneys, spleen and pancreas are grossly
unremarkable. Proximal gastric wall thickening.

Musculoskeletal: Degenerative changes in the spine. Slight
compression of the T12 superior endplate, unchanged.
IMPRESSION: 1. Lung-RADS 2, benign appearance or behavior. Continue annual
screening with low-dose chest CT without contrast in 12 months.
2. New scattered clustered peribronchovascular nodularity, likely
infectious or postinfectious in etiology.
3. Proximal gastric wall thickening can be seen with gastritis.
4. Aortic atherosclerosis (JJR6C-TF2.2). Coronary artery
calcification.
5.  Emphysema ([id number]-SG2.K).

## 2023-11-10 DIAGNOSIS — E785 Hyperlipidemia, unspecified: Secondary | ICD-10-CM | POA: Diagnosis not present

## 2023-11-10 DIAGNOSIS — E1121 Type 2 diabetes mellitus with diabetic nephropathy: Secondary | ICD-10-CM | POA: Diagnosis not present

## 2023-11-11 ENCOUNTER — Other Ambulatory Visit: Payer: Self-pay | Admitting: Emergency Medicine

## 2023-11-11 DIAGNOSIS — Z122 Encounter for screening for malignant neoplasm of respiratory organs: Secondary | ICD-10-CM

## 2023-11-11 DIAGNOSIS — Z87891 Personal history of nicotine dependence: Secondary | ICD-10-CM

## 2023-11-17 DIAGNOSIS — I6523 Occlusion and stenosis of bilateral carotid arteries: Secondary | ICD-10-CM | POA: Diagnosis not present

## 2023-11-17 DIAGNOSIS — H43393 Other vitreous opacities, bilateral: Secondary | ICD-10-CM | POA: Diagnosis not present

## 2023-11-17 DIAGNOSIS — I2584 Coronary atherosclerosis due to calcified coronary lesion: Secondary | ICD-10-CM | POA: Diagnosis not present

## 2023-11-17 DIAGNOSIS — J302 Other seasonal allergic rhinitis: Secondary | ICD-10-CM | POA: Diagnosis not present

## 2023-11-17 DIAGNOSIS — J432 Centrilobular emphysema: Secondary | ICD-10-CM | POA: Diagnosis not present

## 2023-11-17 DIAGNOSIS — M8580 Other specified disorders of bone density and structure, unspecified site: Secondary | ICD-10-CM | POA: Diagnosis not present

## 2023-11-17 DIAGNOSIS — Z23 Encounter for immunization: Secondary | ICD-10-CM | POA: Diagnosis not present

## 2023-11-17 DIAGNOSIS — K219 Gastro-esophageal reflux disease without esophagitis: Secondary | ICD-10-CM | POA: Diagnosis not present

## 2023-11-17 DIAGNOSIS — Z0000 Encounter for general adult medical examination without abnormal findings: Secondary | ICD-10-CM | POA: Diagnosis not present

## 2023-11-17 DIAGNOSIS — E1121 Type 2 diabetes mellitus with diabetic nephropathy: Secondary | ICD-10-CM | POA: Diagnosis not present

## 2024-02-03 ENCOUNTER — Encounter: Payer: Self-pay | Admitting: Acute Care

## 2024-02-08 ENCOUNTER — Inpatient Hospital Stay
Admission: RE | Admit: 2024-02-08 | Discharge: 2024-02-08 | Disposition: A | Source: Ambulatory Visit | Attending: Acute Care | Admitting: Acute Care

## 2024-02-08 DIAGNOSIS — Z87891 Personal history of nicotine dependence: Secondary | ICD-10-CM

## 2024-02-08 DIAGNOSIS — Z122 Encounter for screening for malignant neoplasm of respiratory organs: Secondary | ICD-10-CM

## 2024-02-17 ENCOUNTER — Other Ambulatory Visit: Payer: Self-pay

## 2024-02-17 DIAGNOSIS — F17210 Nicotine dependence, cigarettes, uncomplicated: Secondary | ICD-10-CM

## 2024-02-17 DIAGNOSIS — Z87891 Personal history of nicotine dependence: Secondary | ICD-10-CM

## 2024-02-17 DIAGNOSIS — Z122 Encounter for screening for malignant neoplasm of respiratory organs: Secondary | ICD-10-CM
